# Patient Record
Sex: Male | Born: 1985 | Race: White | Hispanic: No | Marital: Married | Smoking: Never smoker
Health system: Southern US, Community
[De-identification: ages and names within clinical notes are randomized; demographics above are authoritative.]

## PROBLEM LIST (undated history)

## (undated) DIAGNOSIS — F410 Panic disorder [episodic paroxysmal anxiety] without agoraphobia: Secondary | ICD-10-CM

## (undated) DIAGNOSIS — R569 Unspecified convulsions: Secondary | ICD-10-CM

## (undated) HISTORY — DX: Panic disorder (episodic paroxysmal anxiety): F41.0

## (undated) HISTORY — PX: OTHER SURGICAL HISTORY: SHX169

## (undated) HISTORY — DX: Unspecified convulsions: R56.9

## (undated) HISTORY — PX: WISDOM TOOTH EXTRACTION: SHX21

---

## 2012-09-01 ENCOUNTER — Encounter (HOSPITAL_COMMUNITY): Payer: Self-pay | Admitting: *Deleted

## 2012-09-01 ENCOUNTER — Emergency Department (HOSPITAL_COMMUNITY): Payer: 59

## 2012-09-01 ENCOUNTER — Emergency Department (HOSPITAL_COMMUNITY)
Admission: EM | Admit: 2012-09-01 | Discharge: 2012-09-01 | Disposition: A | Discharge status: Home / Self Care | Payer: 59 | Attending: Emergency Medicine | Admitting: Emergency Medicine

## 2012-09-01 DIAGNOSIS — F17200 Nicotine dependence, unspecified, uncomplicated: Secondary | ICD-10-CM | POA: Insufficient documentation

## 2012-09-01 DIAGNOSIS — R569 Unspecified convulsions: Secondary | ICD-10-CM

## 2012-09-01 LAB — COMPREHENSIVE METABOLIC PANEL
ALT: 73 U/L — ABNORMAL HIGH (ref 0–53)
AST: 38 U/L — ABNORMAL HIGH (ref 0–37)
Albumin: 4.6 g/dL (ref 3.5–5.2)
CO2: 20 mEq/L (ref 19–32)
Calcium: 9.6 mg/dL (ref 8.4–10.5)
Chloride: 98 mEq/L (ref 96–112)
GFR calc non Af Amer: >90 mL/min (ref >90)
Sodium: 134 mEq/L — ABNORMAL LOW (ref 135–145)

## 2012-09-01 LAB — CBC WITH DIFFERENTIAL/PLATELET
Basophils Absolute: 0 10*3/uL (ref 0.0–0.1)
Basophils Relative: 0 % (ref 0–1)
Eosinophils Relative: 0 % (ref 0–5)
Lymphocytes Relative: 8 % — ABNORMAL LOW (ref 12–46)
MCHC: 36 g/dL (ref 30.0–36.0)
Neutro Abs: 10.7 10*3/uL — ABNORMAL HIGH (ref 1.7–7.7)
Platelets: 250 10*3/uL (ref 150–400)
RDW: 11.8 % (ref 11.5–15.5)
WBC: 12.4 10*3/uL — ABNORMAL HIGH (ref 4.0–10.5)

## 2012-09-01 NOTE — ED Notes (Signed)
Pt amnesic to event. Reports remembers waking up to paramedics. Reports discomfort to jaw, feels like he was grinding teeth. Was not incontinent of urine. No injury to tongue.

## 2012-09-01 NOTE — ED Notes (Signed)
EMS-pt was at work when he was found by boss on ground, witnessed seizure. Pt was postictal on scene. 18g(L)AC. No hx of the same.

## 2012-09-01 NOTE — ED Provider Notes (Signed)
History     CSN: [id number]  Arrival date & time 09/01/12  1040   First MD Initiated Contact with Patient 09/01/12 1108      Chief Complaint  Patient presents with  . Seizures    (Consider location/radiation/quality/duration/timing/severity/associated sxs/prior treatment) HPI Comments: The patient presents by ems after an apparent seizure.  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  He was at his computer, then woke up with people around him telling him he had a seizure.  He did hit the left side of his head but denies headache or any other complaints leading up to this episode.  No fevers.  Denies excessive alcohol intake and no drug use.  This has never happened to home before.  Patient is a 26 y.o. male presenting with seizures. The history is provided by the patient.  Seizures  This is a new problem. The current episode started less than 1 hour ago. The problem has been resolved. There was 1 seizure. The most recent episode lasted 2 to 5 minutes. Characteristics include rhythmic jerking. The episode was witnessed. There was no sensation of an aura present. The seizures did not continue in the ED. The seizure(s) had no focality. There has been no fever.    History reviewed. No pertinent past medical history.  History reviewed. No pertinent past surgical history.  History reviewed. No pertinent family history.  History  Substance Use Topics  . Smoking status: Current Some Day Smoker  . Smokeless tobacco: Not on file  . Alcohol Use: Yes      Review of Systems  Neurological: Positive for seizures.  All other systems reviewed and are negative.    Allergies  Review of patient's allergies indicates no known allergies.  Home Medications   Current Outpatient Rx  Name Route Sig Dispense Refill  . ZYRTEC PO Oral Take 1 tablet by mouth daily.      BP 141/87  Pulse 114  Temp 98.8 F (37.1 C) (Oral)  Resp 16  SpO2 96%  Physical Exam  Nursing note and vitals  reviewed. Constitutional: He is oriented to person, place, and time. He appears well-developed and well-nourished. No distress.  HENT:  Head: Normocephalic.  Mouth/Throat: Oropharynx is clear and moist.       There is an abrasion/contusion to the left side of the parietal scalp area but no palpable defect.  The tm's are clear bilaterally.  Eyes: EOM are normal. Pupils are equal, round, and reactive to light.  Neck: Normal range of motion. Neck supple.  Cardiovascular: Normal rate and regular rhythm.   No murmur heard. Pulmonary/Chest: Effort normal. No respiratory distress. He has no wheezes.  Abdominal: Soft. Bowel sounds are normal. He exhibits no distension. There is no tenderness.  Musculoskeletal: Normal range of motion. He exhibits no edema.  Neurological: He is alert and oriented to person, place, and time. No cranial nerve deficit. He exhibits normal muscle tone. Coordination normal.  Skin: Skin is warm and dry. He is not diaphoretic.    ED Course  Procedures (including critical care time)   Labs Reviewed  CBC WITH DIFFERENTIAL  COMPREHENSIVE METABOLIC PANEL   No results found.   No diagnosis found.    MDM  The patient was brought here after a new-onset seizure.  He is at baseline and the neuro exam is non-focal.  The workup shows a negative head ct and labs that are essentially unremarkable with the exception of a mildly elevated glucose.  I  have spoken with Dr. Roseanne Reno with neurology who recommends outpatient follow up with neurology.  He will be given the contact information for neurology and is to return prn if symptoms worsen.          Geoffery Lyons, MD 09/01/12 [phone]

## 2013-05-26 IMAGING — CT CT HEAD W/O CM
2 series · 16 of 30 positions shown, 20 images · non-contrast
Comparison: None

CLINICAL DATA: Seizure new onset

CT HEAD WITHOUT CONTRAST
TECHNIQUE: Contiguous axial images were obtained from the base of
the skull through the vertex without contrast.

[Series 3: head w/o · axial · non-contrast · 0.46mm/px · z∈[+300,+430]mm · 13 of 32 slices shown, 17 images]
[im 3/32  brain]
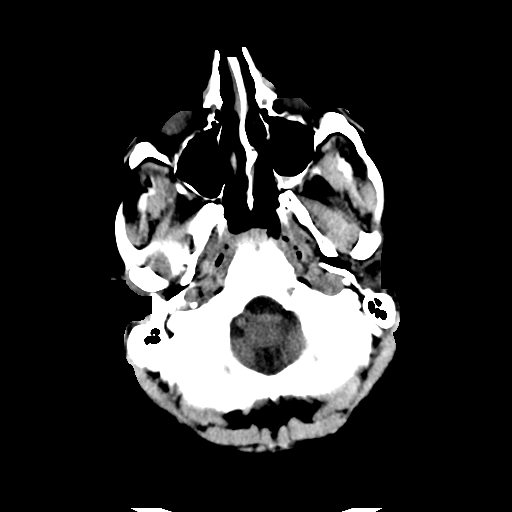
[im 3/32  bone]
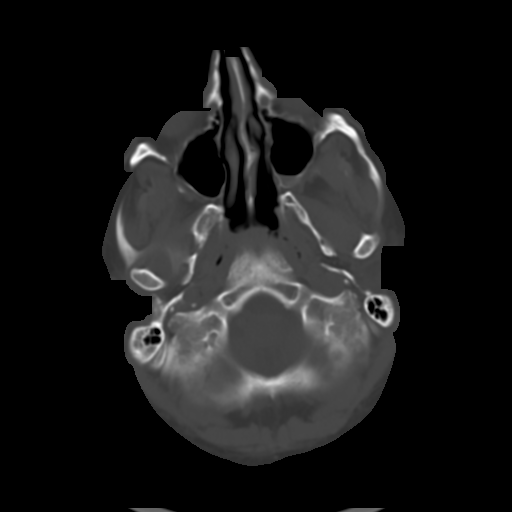
[im 5/32  brain]
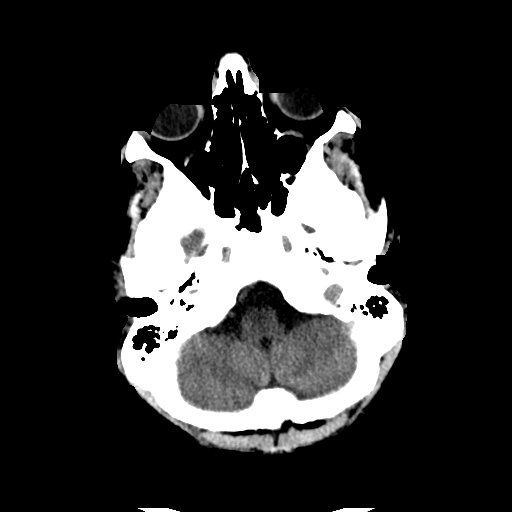
[im 7/32  brain]
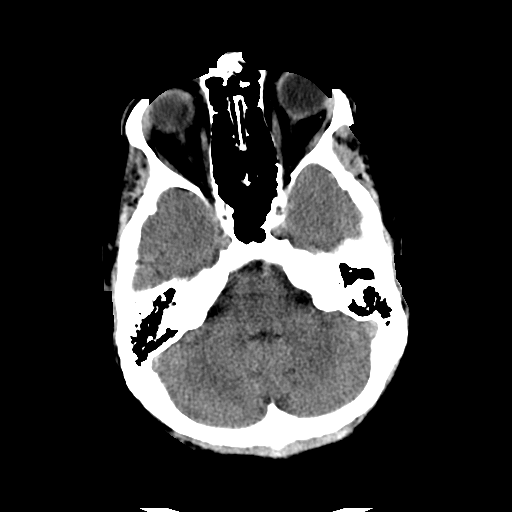
[im 9/32  brain]
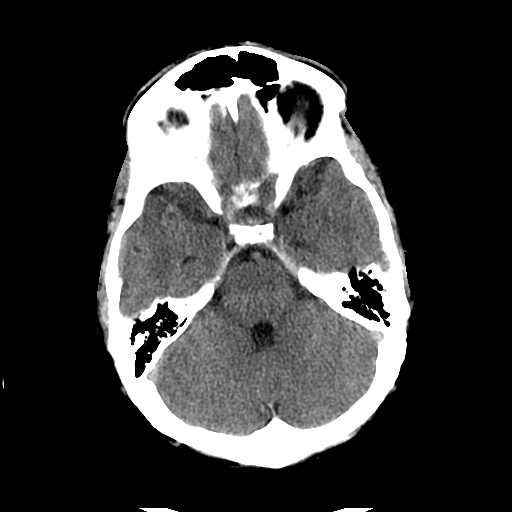
[im 12/32  brain]
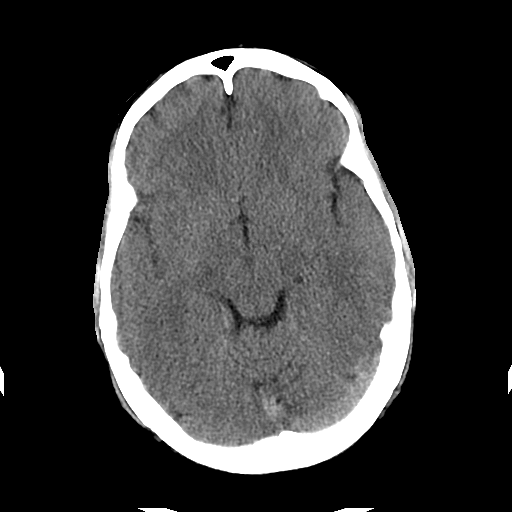
[im 12/32  bone]
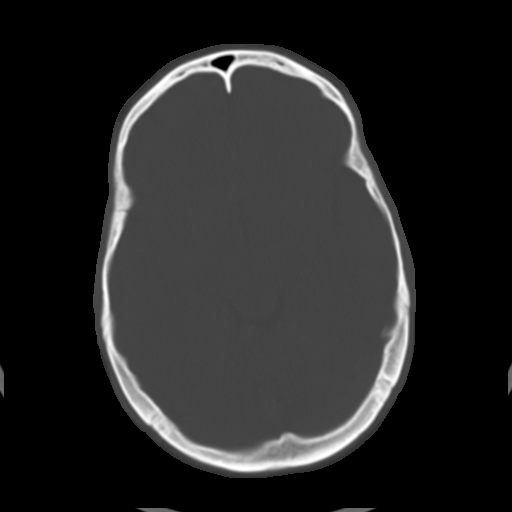
[im 14/32  brain]
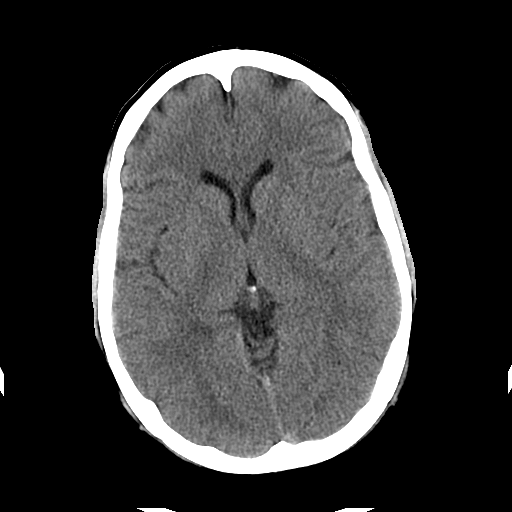
[im 16/32  brain]
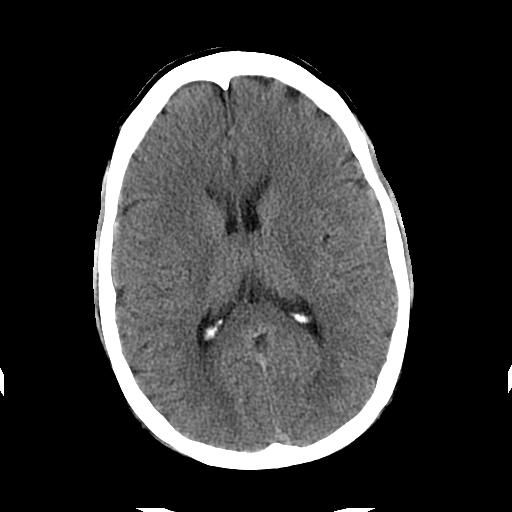
[im 18/32  brain]
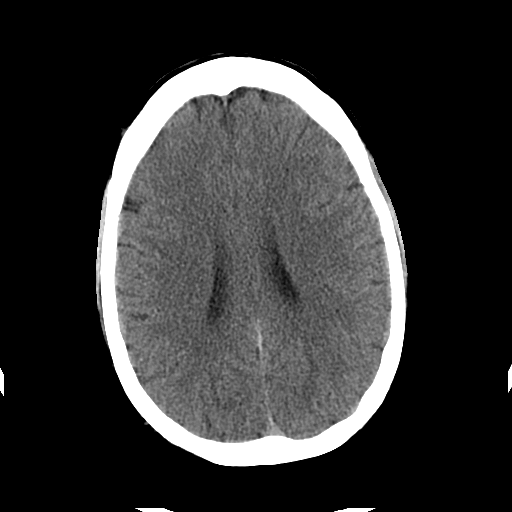
[im 20/32  brain]
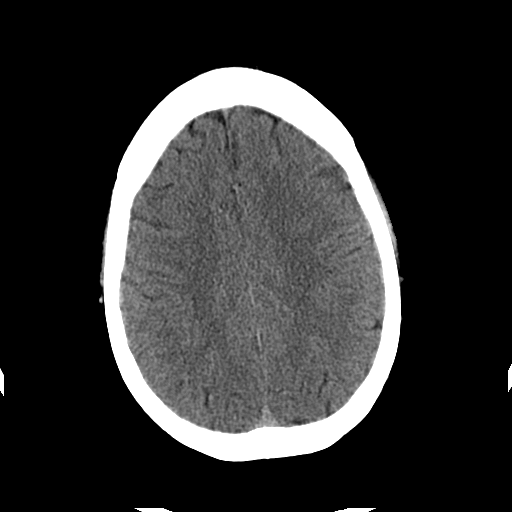
[im 20/32  bone]
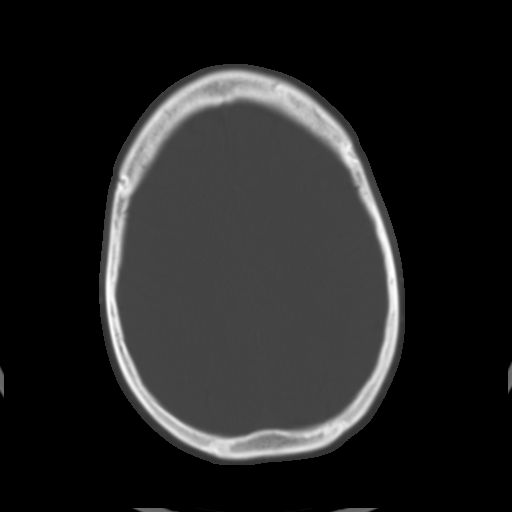
[im 23/32  brain]
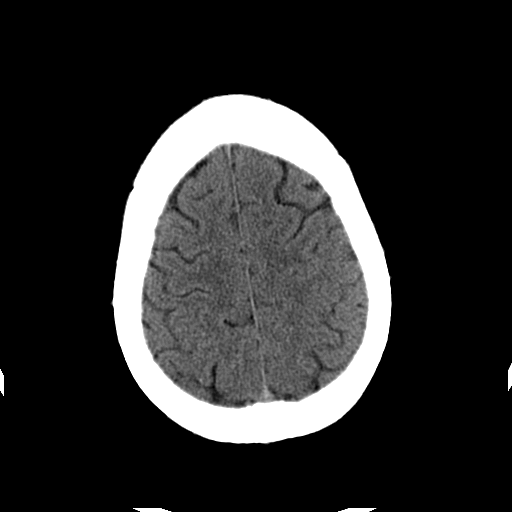
[im 25/32  brain]
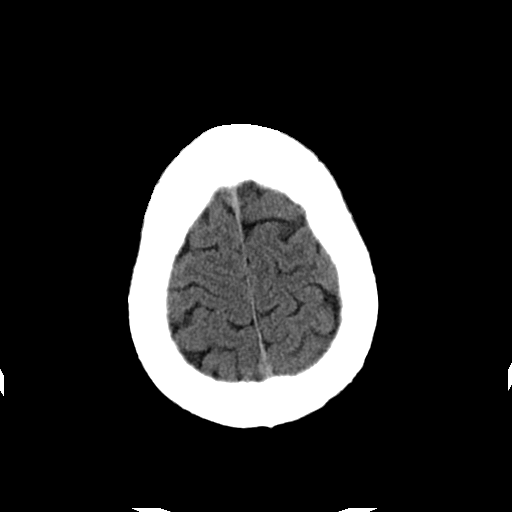
[im 27/32  brain]
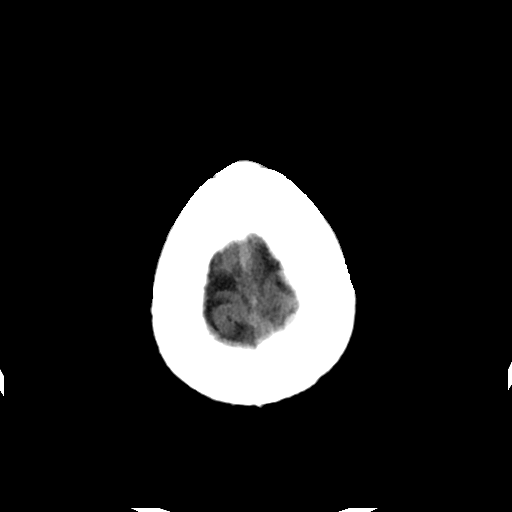
[im 29/32  brain]
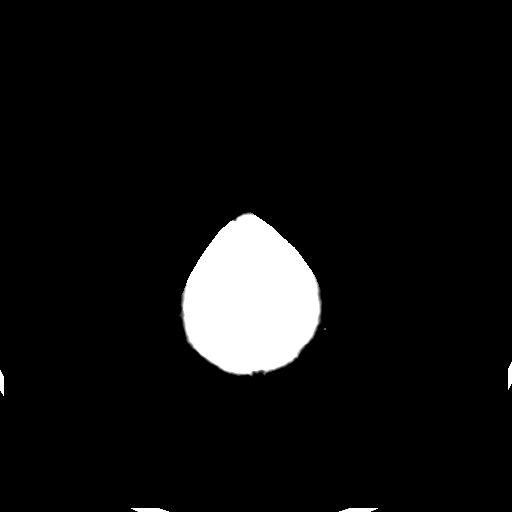
[im 29/32  bone]
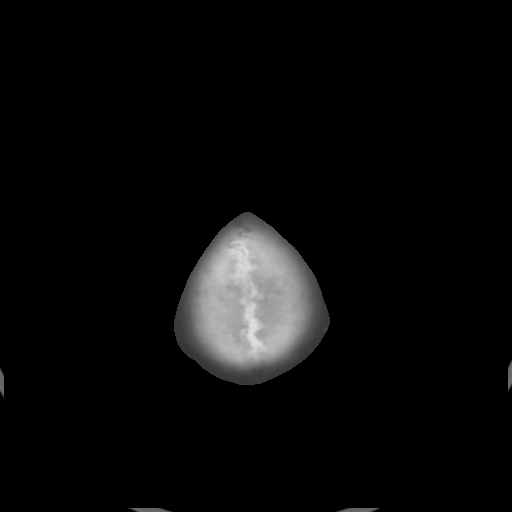

[Series 4: head w/o bone · axial · non-contrast · 0.46mm/px · z∈[+300,+345]mm · 3 of 32 slices shown]
[im 3/32  bone]
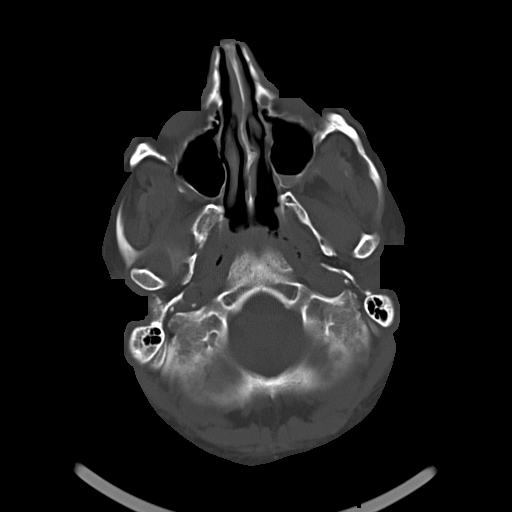
[im 7/32  bone]
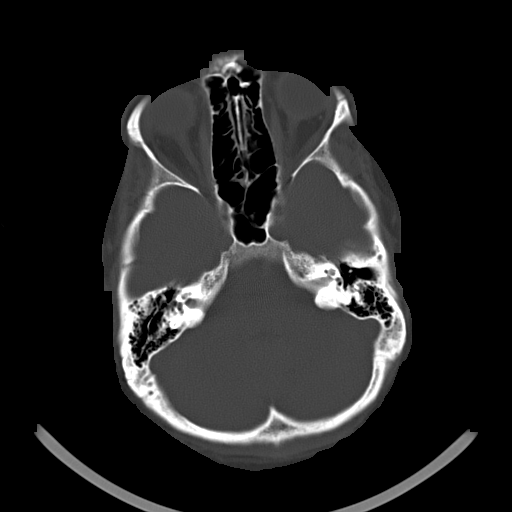
[im 12/32  bone]
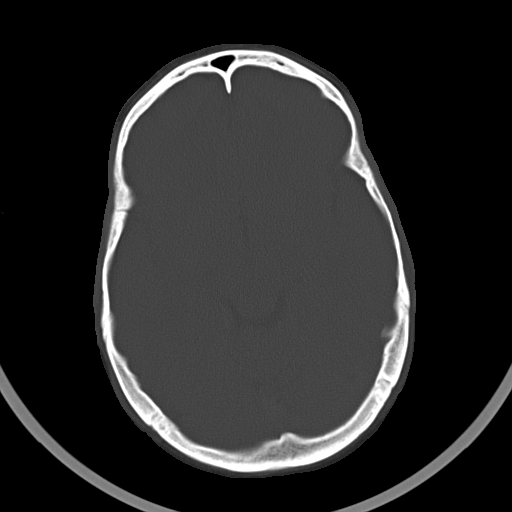

[16 of 30 positions shown; findings below may reference images not displayed]

FINDINGS: Ventricle size is normal.  Negative for infarct mass or
hemorrhage.  No edema or mass effect in the brain.

Calvarium is intact.  Small air-fluid levels in the sphenoid sinus.
Air-fluid level left maxillary sinus.
IMPRESSION: Normal CT of the brain.

Sinusitis.

## 2013-07-22 ENCOUNTER — Telehealth: Payer: Self-pay | Admitting: Nurse Practitioner

## 2013-07-22 ENCOUNTER — Encounter: Payer: Self-pay | Admitting: *Deleted

## 2013-07-22 ENCOUNTER — Encounter: Payer: Self-pay | Admitting: Nurse Practitioner

## 2013-08-09 ENCOUNTER — Encounter: Payer: Self-pay | Admitting: Nurse Practitioner

## 2013-08-09 ENCOUNTER — Ambulatory Visit (INDEPENDENT_AMBULATORY_CARE_PROVIDER_SITE_OTHER): Payer: 59 | Admitting: Nurse Practitioner

## 2013-08-09 VITALS — BP 134/81 | HR 76 | Ht 71.0 in | Wt 190.0 lb

## 2013-08-09 DIAGNOSIS — G40309 Generalized idiopathic epilepsy and epileptic syndromes, not intractable, without status epilepticus: Secondary | ICD-10-CM | POA: Insufficient documentation

## 2013-08-09 DIAGNOSIS — F410 Panic disorder [episodic paroxysmal anxiety] without agoraphobia: Secondary | ICD-10-CM | POA: Insufficient documentation

## 2013-08-09 MED ORDER — CLONAZEPAM 1 MG PO TABS: 1.0000 mg | ORAL_TABLET | Freq: Three times a day (TID) | ORAL | Status: DC

## 2013-08-09 NOTE — Patient Instructions (Addendum)
Continue Clonazepam 1mg   3 times daily Will renew  Prescription Followup 6-8 months

## 2013-08-09 NOTE — Progress Notes (Signed)
  Reason for visit followup for panic disorder HPI: [name] is a 27 year old right-handed white male with a history of a panic disorder. The patient has been placed on clonazepam, and he is tolerating the medication quite well. The patient is on 1 mg, taking 3 times daily .The patient indicates that overall, he feels as if the medication has changed his life, and he is able to function much better. The patient is tolerating medication well without drowsiness or gait instability. Patient has a history of isolated witnessed generalized seizure in September 2013. CT was unremarkable, normal EEG. No further seizure activity since that time  ROS:  Negative   Medications Current Outpatient Prescriptions on File Prior to Visit  Medication Sig Dispense Refill  . Cetirizine HCl (ZYRTEC PO) Take 1 tablet by mouth daily.       No current facility-administered medications on file prior to visit.    Allergies No Known Allergies  Physical Exam General: well developed, well nourished, seated, in no evident distress Neurologic Exam Mental Status: Awake and fully alert. Oriented to place and time. Follows all commands. Speech and language normal.   Cranial Nerves:  Pupils equal, briskly reactive to light. Extraocular movements full without nystagmus. Visual fields full to confrontation. Hearing intact and symmetric to finger snap. Facial sensation intact. Face, tongue, palate move normally and symmetrically. Neck flexion and extension normal.  Motor: Normal bulk and tone. Normal strength in all tested extremity muscles.No focal weakness Coordination: Rapid alternating movements normal in all extremities. Finger-to-nose and heel-to-shin performed accurately bilaterally. Gait and Station: Arises from chair without difficulty. Stance is normal. Gait demonstrates normal stride length and balance . Able to heel, toe and tandem walk without difficulty.  Reflexes: 2+ and symmetric. Toes downgoing.      ASSESSMENT: Panic disorder doing well on clonazepam     PLAN: Continue Clonazepam 1mg   3 times daily Will renew  Prescription Followup 6-8 months  Nilda Riggs, GNP-BC APRN

## 2013-08-09 NOTE — Progress Notes (Signed)
I have read the note, and I agree with the clinical assessment and plan.  WILLIS,CHARLES KEITH   

## 2013-08-24 ENCOUNTER — Ambulatory Visit: Payer: Self-pay | Admitting: Nurse Practitioner

## 2013-11-21 ENCOUNTER — Encounter: Payer: Self-pay | Admitting: Nurse Practitioner

## 2014-02-09 ENCOUNTER — Ambulatory Visit: Payer: 59 | Admitting: Nurse Practitioner

## 2014-02-16 ENCOUNTER — Other Ambulatory Visit: Payer: Self-pay | Admitting: Nurse Practitioner

## 2014-02-17 ENCOUNTER — Telehealth: Payer: Self-pay | Admitting: Nurse Practitioner

## 2014-02-17 NOTE — Telephone Encounter (Signed)
Patient calling to state that [facility] in [place] should have faxed over a request for clonazepam 1 mg 3 times daily yesterday. Please call to advise.

## 2014-02-17 NOTE — Telephone Encounter (Signed)
Please see previous note.  This has already been faxed to the pharmacy.  I called the patient back.  Got no answer.  Left message.

## 2014-02-17 NOTE — Telephone Encounter (Signed)
Rx signed and faxed.

## 2014-03-17 NOTE — Telephone Encounter (Signed)
Closing encounter

## 2014-03-28 ENCOUNTER — Ambulatory Visit (INDEPENDENT_AMBULATORY_CARE_PROVIDER_SITE_OTHER): Payer: 59 | Admitting: Nurse Practitioner

## 2014-03-28 ENCOUNTER — Encounter: Payer: Self-pay | Admitting: Nurse Practitioner

## 2014-03-28 ENCOUNTER — Encounter (INDEPENDENT_AMBULATORY_CARE_PROVIDER_SITE_OTHER): Payer: Self-pay

## 2014-03-28 VITALS — BP 135/86 | HR 80 | Ht 71.0 in | Wt 201.0 lb

## 2014-03-28 DIAGNOSIS — G40309 Generalized idiopathic epilepsy and epileptic syndromes, not intractable, without status epilepticus: Secondary | ICD-10-CM

## 2014-03-28 DIAGNOSIS — F410 Panic disorder [episodic paroxysmal anxiety] without agoraphobia: Secondary | ICD-10-CM

## 2014-03-28 NOTE — Progress Notes (Signed)
[facility]  PATIENT: Jay Francis DOB: 07/14/1986   REASON FOR VISIT: follow up for panic disorder   HISTORY OF PRESENT ILLNESS: Jay Francis, 28 year old male returns for followup. He has a history of isolated seizure in September 2013 without further seizure events. He also has panic disorder which is currently well controlled on clonazepam. He denies any side effects to the medication, no drowsiness or gait instability. He works full time. He returns for reevaluation   HISTORY:of a panic disorder. The patient has been placed on clonazepam, and he is tolerating the medication quite well. The patient is on 1 mg, taking 3 times daily .The patient indicates that overall, he feels as if the medication has changed his life, and he is able to function much better. The patient is tolerating medication well without drowsiness or gait instability. Patient has a history of isolated witnessed generalized seizure in September 2013. CT was unremarkable, normal EEG. No further seizure activity since that time   REVIEW OF SYSTEMS: Full 14 system review of systems performed and notable only for those listed, all others are neg:  Constitutional: N/A  Cardiovascular: N/A  Ear/Nose/Throat: N/A  Skin: N/A  Eyes: N/A  Respiratory: N/A  Gastroitestinal: N/A  Hematology/Lymphatic: N/A  Endocrine: N/A Musculoskeletal:N/A  Allergy/Immunology: N/A  Neurological: N/A Psychiatric: Anxiety  ALLERGIES: No Known Allergies  HOME MEDICATIONS: Outpatient Prescriptions Prior to Visit  Medication Sig Dispense Refill  . Cetirizine HCl (ZYRTEC PO) Take 1 tablet by mouth daily.      . clonazePAM (KLONOPIN) 1 MG tablet TAKE 1 TABLET BY MOUTH THREE TIMES DAILY  90 tablet  5   No facility-administered medications prior to visit.    PAST MEDICAL HISTORY: Past Medical History  Diagnosis Date  . Seizures   . Panic disorder without agoraphobia     PAST SURGICAL HISTORY: Past Surgical History   Procedure Laterality Date  . None      FAMILY HISTORY: History reviewed. No pertinent family history.  SOCIAL HISTORY: History   Social History  . Marital Status: Single    Spouse Name: N/A    Number of Children: 0  . Years of Education: Associates   Occupational History  . LOGISTICS    Social History Main Topics  . Smoking status: Former Games developermoker  . Smokeless tobacco: Never Used  . Alcohol Use: No  . Drug Use: No  . Sexual Activity: Not on file   Other Topics Concern  . Not on file   Social History Narrative   Patient is single.    Patient has no children.    Patient is full-time.    Patient has an Associates degree.   Patient is right handed.      PHYSICAL EXAM  Filed Vitals:   03/28/14 0828  BP: 135/86  Pulse: 80  Height: 5\' 11"  (1.803 m)  Weight: 201 lb (91.173 kg)   Body mass index is 28.05 kg/(m^2).  Generalized: Well developed, in no acute distress  Neurological examination   Mentation: Alert oriented to time, place, history taking. Follows all commands speech and language fluent  Cranial nerve II-XII: Pupils were equal round reactive to light extraocular movements were full, visual field were full on confrontational test. Facial sensation and strength were normal. hearing was intact to finger rubbing bilaterally. Uvula tongue midline. head turning and shoulder shrug were normal and symmetric.Tongue protrusion into cheek strength was normal. Motor: normal bulk and tone, full strength in the BUE, BLE,  No focal weakness  Coordination: finger-nose-finger, heel-to-shin bilaterally, no dysmetria Reflexes: Brachioradialis 2/2, biceps 2/2, triceps 2/2, patellar 2/2, Achilles 2/2, plantar responses were flexor bilaterally. Gait and Station: Rising up from seated position without assistance, normal stance,  moderate stride, good arm swing, smooth turning, able to perform tiptoe, and heel walking without difficulty. Tandem gait is steady  DIAGNOSTIC DATA  (LABS, IMAGING, TESTING) - ASSESSMENT AND PLAN  28 y.o. year old male  has a past medical history of Seizures and Panic disorder without agoraphobia. here  to follow up. Doing well on Clonazepam for panic disorder.  Continue clonazepam at current dose , does not need  refills Followup yearly and when necessary Nilda RiggsNancy Carolyn Valeriano Bain, [facility] HospitalGNP, [facility] AllianceBC, APRN  [facility] [address], [address] [place], [place] [address] [phone]

## 2014-03-28 NOTE — Patient Instructions (Signed)
Continue clonazepam at current dose , does not need  Refills Followup yearly and when necessary

## 2014-03-28 NOTE — Progress Notes (Signed)
I have read the note, and I agree with the clinical assessment and plan.  Jay Francis   

## 2014-08-23 ENCOUNTER — Telehealth: Payer: Self-pay | Admitting: Neurology

## 2014-08-23 ENCOUNTER — Other Ambulatory Visit: Payer: Self-pay | Admitting: Neurology

## 2014-08-23 NOTE — Telephone Encounter (Signed)
Patient called and relayed that the pharmacy did not have his prescription for Klonopin yet.  He asked that it be faxed so he can pick it up on his way to work in am.  I have faxed the prescription to his [facility] pharmacy and received confirmation.

## 2015-02-19 ENCOUNTER — Other Ambulatory Visit: Payer: Self-pay | Admitting: Neurology

## 2015-02-20 ENCOUNTER — Other Ambulatory Visit: Payer: Self-pay

## 2015-02-20 MED ORDER — CLONAZEPAM 1 MG PO TABS: 1.0000 mg | ORAL_TABLET | Freq: Three times a day (TID) | ORAL | Status: DC

## 2015-02-20 NOTE — Telephone Encounter (Signed)
Rx signed and faxed.

## 2015-03-29 ENCOUNTER — Ambulatory Visit (INDEPENDENT_AMBULATORY_CARE_PROVIDER_SITE_OTHER): Payer: 59 | Admitting: Nurse Practitioner

## 2015-03-29 ENCOUNTER — Encounter: Payer: Self-pay | Admitting: Nurse Practitioner

## 2015-03-29 VITALS — BP 142/91 | HR 67 | Ht 71.0 in | Wt 196.2 lb

## 2015-03-29 DIAGNOSIS — G40309 Generalized idiopathic epilepsy and epileptic syndromes, not intractable, without status epilepticus: Secondary | ICD-10-CM

## 2015-03-29 DIAGNOSIS — F410 Panic disorder [episodic paroxysmal anxiety] without agoraphobia: Secondary | ICD-10-CM

## 2015-03-29 MED ORDER — CLONAZEPAM 1 MG PO TABS: 1.0000 mg | ORAL_TABLET | Freq: Three times a day (TID) | ORAL | Status: DC

## 2015-03-29 NOTE — Progress Notes (Signed)
[facility]  PATIENT: Jay Francis DOB: [birth year]   REASON FOR VISIT:follow up for isolated seizure/ panic disorder HISTORY FROM:patient    HISTORY OF PRESENT ILLNESS:Jay Francis, 29 year old male returns for followup. He has a history of isolated seizure in September 2013 without further seizure events. He also has panic disorder which is currently well controlled on clonazepam. He denies any side effects to the medication, no drowsiness or gait instability. He works full time. He returns for reevaluation. He does not have a primary care provider.   HISTORY:of a panic disorder. The patient has been placed on clonazepam, and he is tolerating the medication quite well. The patient is on 1 mg, taking 3 times daily .The patient indicates that overall, he feels as if the medication has changed his life, and he is able to function much better. The patient is tolerating medication well without drowsiness or gait instability. Patient has a history of isolated witnessed generalized seizure in September 2013. CT was unremarkable, normal EEG. No further seizure activity since that time   REVIEW OF SYSTEMS: Full 14 system review of systems performed and notable only for those listed, all others are neg:  Constitutional: neg  Cardiovascular: neg Ear/Nose/Throat: neg  Skin: neg Eyes: neg Respiratory: neg Gastroitestinal: neg  Hematology/Lymphatic: neg  Endocrine: neg Musculoskeletal:neg Allergy/Immunology: neg Neurological: Isolated seizure event Psychiatric: Panic disorder Sleep : neg   ALLERGIES: Allergies  Allergen Reactions  . Amoxicillin Diarrhea and Nausea Only    HOME MEDICATIONS: Outpatient Prescriptions Prior to Visit  Medication Sig Dispense Refill  . Cetirizine HCl (ZYRTEC PO) Take 1 tablet by mouth daily.    . clonazePAM (KLONOPIN) 1 MG tablet Take 1 tablet (1 mg total) by mouth 3 (three) times daily. 90 tablet 1   No facility-administered medications  prior to visit.    PAST MEDICAL HISTORY: Past Medical History  Diagnosis Date  . Seizures   . Panic disorder without agoraphobia     PAST SURGICAL HISTORY: Past Surgical History  Procedure Laterality Date  . None      FAMILY HISTORY: Family History  Problem Relation Age of Onset  . Healthy Mother   . Healthy Father   . Seizures Maternal Grandfather     SOCIAL HISTORY: History   Social History  . Marital Status: Single    Spouse Name: N/A  . Number of Children: 0  . Years of Education: Associates   Occupational History  . LOGISTICS    Social History Main Topics  . Smoking status: Former Games developer  . Smokeless tobacco: Never Used  . Alcohol Use: No  . Drug Use: No  . Sexual Activity: Not on file   Other Topics Concern  . Not on file   Social History Narrative   Patient is single.    Patient has no children.    Patient is full-time.    Patient has an Associates degree.   Patient is right handed.      PHYSICAL EXAM  Filed Vitals:   03/29/15 0754  BP: 142/91  Pulse: 67  Height:  (1.803 m)  Weight: 196 lb 3.2 oz (88.996 kg)   Body mass index is 27.38 kg/(m^2).  Generalized: Well developed, in no acute distress  Head: normocephalic and atraumatic,. Oropharynx benign  Neck: Supple,  Neurological examination   Mentation: Alert oriented to time, place, history taking. Attention span and concentration appropriate. Recent and remote memory intact.  Follows all commands speech and language fluent.   Cranial nerve  II-XII: Pupils were equal round reactive to light extraocular movements were full, visual field were full on confrontational test. Facial sensation and strength were normal. hearing was intact to finger rubbing bilaterally. Uvula tongue midline. head turning and shoulder shrug were normal and symmetric.Tongue protrusion into cheek strength was normal. Motor: normal bulk and tone, full strength in the BUE, BLE, fine finger movements normal, no  pronator drift. No focal weakness Sensory: normal and symmetric to light touch, pinprick, and  Vibration, proprioception  Coordination: finger-nose-finger, heel-to-shin bilaterally, no dysmetria Reflexes: Brachioradialis 2/2, biceps 2/2, triceps 2/2, patellar 2/2, Achilles 2/2, plantar responses were flexor bilaterally. Gait and Station: Rising up from seated position without assistance, normal stance,  moderate stride, good arm swing, smooth turning, able to perform tiptoe, and heel walking without difficulty. Tandem gait is steady  DIAGNOSTIC DATA (LABS, IMAGING, TESTING) -  ASSESSMENT AND PLAN  29 y.o. year old male  has a past medical history of Seizures and Panic disorder without agoraphobia. here to follow-up. He has not had further seizure events and his panic disorder is under control with Klonopin  Continue clonazepam at current dose Rx to patient Call for any seizure activity Follow-up yearly and when necessary Nilda RiggsNancy Carolyn Lavinia Mcneely, [facility] AdaGNP, [facility]BC, APRN  [facility] [address], [address] [place], [place] [address] [phone]

## 2015-03-29 NOTE — Progress Notes (Signed)
I have read the note, and I agree with the clinical assessment and plan.  Paulena Servais KEITH   

## 2015-03-29 NOTE — Patient Instructions (Signed)
Continue clonazepam at current dose Rx to patient Call for any seizure activity Follow-up yearly and when necessary

## 2015-10-18 ENCOUNTER — Other Ambulatory Visit: Payer: Self-pay | Admitting: Nurse Practitioner

## 2015-10-19 ENCOUNTER — Telehealth: Payer: Self-pay | Admitting: Neurology

## 2015-10-19 NOTE — Telephone Encounter (Signed)
I called the pharmacy and called in another refill for the clonazepam with 90 tablets given, 5 refills. I records indicate that this prescription was written yesterday, if the pharmacy gets this prescription, they are to disregard the prescription.

## 2015-10-19 NOTE — Telephone Encounter (Signed)
A prescription for clonazepam was called in. 

## 2016-03-21 ENCOUNTER — Encounter: Payer: Self-pay | Admitting: Nurse Practitioner

## 2016-03-21 ENCOUNTER — Ambulatory Visit (INDEPENDENT_AMBULATORY_CARE_PROVIDER_SITE_OTHER): Payer: 59 | Admitting: Nurse Practitioner

## 2016-03-21 VITALS — BP 134/81 | HR 87 | Ht 71.0 in | Wt 189.6 lb

## 2016-03-21 DIAGNOSIS — F410 Panic disorder [episodic paroxysmal anxiety] without agoraphobia: Secondary | ICD-10-CM

## 2016-03-21 DIAGNOSIS — G40309 Generalized idiopathic epilepsy and epileptic syndromes, not intractable, without status epilepticus: Secondary | ICD-10-CM | POA: Diagnosis not present

## 2016-03-21 MED ORDER — CLONAZEPAM 1 MG PO TABS: 1.0000 mg | ORAL_TABLET | Freq: Three times a day (TID) | ORAL | Status: DC

## 2016-03-21 NOTE — Progress Notes (Signed)
[facility]  PATIENT: Jay Francis DOB: 09/29/1986   REASON FOR VISIT: Follow-up for isolated generalized seizure and panic disorder HISTORY FROM: Patient    HISTORY OF PRESENT ILLNESS:Jay Francis, 30 year old male returns for followup. He has a history of isolated seizure in September 2013 without further seizure events. He also has panic disorder which is currently well controlled on clonazepam. He denies any side effects to the medication, no drowsiness or gait instability. He works full time. He returns for reevaluation. He does not have a primary care provider. He states that his brother was recently diagnosed with panic disorder.   HISTORY:of a panic disorder. The patient has been placed on clonazepam, and he is tolerating the medication quite well. The patient is on 1 mg, taking 3 times daily .The patient indicates that overall, he feels as if the medication has changed his life, and he is able to function much better. The patient is tolerating medication well without drowsiness or gait instability. Patient has a history of isolated witnessed generalized seizure in September 2013. CT was unremarkable, normal EEG. No further seizure activity since that time    REVIEW OF SYSTEMS: Full 14 system review of systems performed and notable only for those listed, all others are neg:  Constitutional: neg  Cardiovascular: neg Ear/Nose/Throat: neg  Skin: neg Eyes: neg Respiratory: neg Gastroitestinal: neg  Hematology/Lymphatic: neg  Endocrine: neg Musculoskeletal:neg Allergy/Immunology: neg Neurological: Isolated seizure event  Psychiatric: Depression and anxiety panic disorder Sleep : neg   ALLERGIES: Allergies  Allergen Reactions  . Amoxicillin Diarrhea and Nausea Only    HOME MEDICATIONS: Outpatient Prescriptions Prior to Visit  Medication Sig Dispense Refill  . Cetirizine HCl (ZYRTEC PO) Take 1 tablet by mouth daily.    . clonazePAM (KLONOPIN) 1 MG  tablet TAKE 1 TABLET BY MOUTH THREE TIMES DAILY 90 tablet 5  . Multiple Vitamin (MULTIVITAMIN) capsule Take 1 capsule by mouth daily.     No facility-administered medications prior to visit.    PAST MEDICAL HISTORY: Past Medical History  Diagnosis Date  . Seizures (HCC)   . Panic disorder without agoraphobia     PAST SURGICAL HISTORY: Past Surgical History  Procedure Laterality Date  . None      FAMILY HISTORY: Family History  Problem Relation Age of Onset  . Healthy Mother   . Healthy Father   . Seizures Maternal Grandfather     SOCIAL HISTORY: Social History   Social History  . Marital Status: Single    Spouse Name: N/A  . Number of Children: 0  . Years of Education: Associates   Occupational History  . LOGISTICS    Social History Main Topics  . Smoking status: Former Games developermoker  . Smokeless tobacco: Never Used  . Alcohol Use: No  . Drug Use: No  . Sexual Activity: Not on file   Other Topics Concern  . Not on file   Social History Narrative   Patient is single.    Patient has no children.    Patient is full-time.    Patient has an Associates degree.   Patient is right handed.      PHYSICAL EXAM  Filed Vitals:   03/21/16 0758  BP: 134/81  Pulse: 87  Height: 5\' 11"  (1.803 m)  Weight: 189 lb 9.6 oz (86.002 kg)   Body mass index is 26.46 kg/(m^2). Generalized: Well developed, in no acute distress  Head: normocephalic and atraumatic,. Oropharynx benign  Neck: Supple,  Neurological examination  Mentation: Alert oriented to time, place, history taking. Attention span and concentration appropriate. Recent and remote memory intact. Follows all commands speech and language fluent.   Cranial nerve II-XII: Pupils were equal round reactive to light extraocular movements were full, visual field were full on confrontational test. Facial sensation and strength were normal. hearing was intact to finger rubbing bilaterally. Uvula tongue midline. head  turning and shoulder shrug were normal and symmetric.Tongue protrusion into cheek strength was normal. Motor: normal bulk and tone, full strength in the BUE, BLE, fine finger movements normal, no pronator drift. No focal weakness Sensory: normal and symmetric to light touch, pinprick, and Vibration,  Coordination: finger-nose-finger, heel-to-shin bilaterally, no dysmetria Reflexes: Brachioradialis 2/2, biceps 2/2, triceps 2/2, patellar 2/2, Achilles 2/2, plantar responses were flexor bilaterally. Gait and Station: Rising up from seated position without assistance, normal stance, moderate stride, good arm swing, smooth turning, able to perform tiptoe, and heel walking without difficulty. Tandem gait is steady  DIAGNOSTIC DATA (LABS, IMAGING, TESTING) -  ASSESSMENT AND PLAN 30 y.o. year old male  has a past medical history of isolated seizure and Panic disorder without agoraphobia. here to follow-up. He has not had further seizure events and his panic disorder is under control with Klonopin  Continue clonazepam at current dose Rx to patient Call for any seizure activity Follow-up yearly and when necessary Nilda Riggs, [facility], [facility], APRN  [facility] [address] [place], [place] [address] [phone]

## 2016-03-21 NOTE — Progress Notes (Signed)
I have read the note, and I agree with the clinical assessment and plan.  Jay Francis KEITH   

## 2016-03-21 NOTE — Patient Instructions (Signed)
Continue clonazepam at current dose Rx to patient Call for any seizure activity Follow-up yearly and when necessary

## 2016-03-28 ENCOUNTER — Ambulatory Visit: Payer: 59 | Admitting: Nurse Practitioner

## 2016-06-30 ENCOUNTER — Telehealth: Payer: Self-pay | Admitting: Nurse Practitioner

## 2016-06-30 NOTE — Telephone Encounter (Signed)
Pt at work this am.  Had ? Seizures, passed out.   He stated has not had sz since 2013, and this was not like the last one. He has been over worked, no food, no sleep.   Does not think it sz.  I told him that would seek urgent care evaluation today.  Appt made for tomorrow at 330pm with CM/NP for eval.    Recommended having driver take him, as ? Sz.  He verbalized understanding.  Needs note for work (ok to go back).

## 2016-06-30 NOTE — Telephone Encounter (Signed)
Patient called, states he had seizure at work this morning and needs a note before he can return to work.

## 2016-06-30 NOTE — Telephone Encounter (Signed)
No work note until seen

## 2016-07-01 ENCOUNTER — Ambulatory Visit (INDEPENDENT_AMBULATORY_CARE_PROVIDER_SITE_OTHER): Payer: 59 | Admitting: Nurse Practitioner

## 2016-07-01 ENCOUNTER — Encounter: Payer: Self-pay | Admitting: *Deleted

## 2016-07-01 ENCOUNTER — Encounter: Payer: Self-pay | Admitting: Nurse Practitioner

## 2016-07-01 VITALS — BP 147/97 | HR 98 | Ht 71.0 in | Wt 183.0 lb

## 2016-07-01 DIAGNOSIS — G40309 Generalized idiopathic epilepsy and epileptic syndromes, not intractable, without status epilepticus: Secondary | ICD-10-CM | POA: Diagnosis not present

## 2016-07-01 DIAGNOSIS — F410 Panic disorder [episodic paroxysmal anxiety] without agoraphobia: Secondary | ICD-10-CM

## 2016-07-01 NOTE — Progress Notes (Addendum)
[facility]  PATIENT: Marcell Barlowndrew Ricotta DOB: 10/03/1986   REASON FOR VISIT: Follow-up for generalized epilepsy, panic disorder HISTORY FROM: Patient    HISTORY OF PRESENT ILLNESS:[name], 30 year old male returns for followup. He has a history of isolated seizure in September 2013 and another seizure event Sunday the 16th of July after missing 2 doses of his clonazepam . He also had not eaten and he had not slept well the night before. He also has panic disorder which is currently well controlled on clonazepam. He denies any side effects to the medication, no drowsiness or gait instability. He works full time. He returns for reevaluation. He does not have a primary care provider.   HISTORY:of a panic disorder. The patient has been placed on clonazepam, and he is tolerating the medication quite well. The patient is on 1 mg, taking 3 times daily .The patient indicates that overall, he feels as if the medication has changed his life, and he is able to function much better. The patient is tolerating medication well without drowsiness or gait instability. Patient has a history of isolated witnessed generalized seizure in September 2013. CT was unremarkable, normal EEG. No further seizure activity since that time   REVIEW OF SYSTEMS: Full 14 system review of systems performed and notable only for those listed, all others are neg:  Constitutional: neg  Cardiovascular: neg Ear/Nose/Throat: neg  Skin: neg Eyes: neg Respiratory: neg Gastroitestinal: neg  Hematology/Lymphatic: neg  Endocrine: neg Musculoskeletal:neg Allergy/Immunology: neg Neurological: Seizure  Psychiatric: Panic disorder Sleep : neg   ALLERGIES: Allergies  Allergen Reactions  . Amoxicillin Diarrhea and Nausea Only    HOME MEDICATIONS: Outpatient Prescriptions Prior to Visit  Medication Sig Dispense Refill  . Cetirizine HCl (ZYRTEC PO) Take 1 tablet by mouth daily.    . clonazePAM (KLONOPIN) 1 MG  tablet Take 1 tablet (1 mg total) by mouth 3 (three) times daily. 90 tablet 5  . Multiple Vitamin (MULTIVITAMIN) capsule Take 1 capsule by mouth daily.     No facility-administered medications prior to visit.    PAST MEDICAL HISTORY: Past Medical History  Diagnosis Date  . Seizures (HCC)   . Panic disorder without agoraphobia     PAST SURGICAL HISTORY: Past Surgical History  Procedure Laterality Date  . None      FAMILY HISTORY: Family History  Problem Relation Age of Onset  . Healthy Mother   . Healthy Father   . Seizures Maternal Grandfather     SOCIAL HISTORY: Social History   Social History  . Marital Status: Single    Spouse Name: N/A  . Number of Children: 0  . Years of Education: Associates   Occupational History  . LOGISTICS    Social History Main Topics  . Smoking status: Former Games developermoker  . Smokeless tobacco: Never Used  . Alcohol Use: No  . Drug Use: No  . Sexual Activity: Not on file   Other Topics Concern  . Not on file   Social History Narrative   Patient is single.    Patient has no children.    Patient is full-time.    Patient has an Associates degree.   Patient is right handed.      PHYSICAL EXAM  Filed Vitals:   07/01/16 1517  BP: 147/97  Pulse: 98  Height: 5\' 11"  (1.803 m)  Weight: 183 lb (83.008 kg)   Body mass index is 25.53 kg/(m^2). Generalized: Well developed, in no acute distress  Head: normocephalic and atraumatic,.  Oropharynx benign  Neck: Supple,  Neurological examination   Mentation: Alert oriented to time, place, history taking. Attention span and concentration appropriate. Recent and remote memory intact. Follows all commands speech and language fluent.   Cranial nerve II-XII: Pupils were equal round reactive to light extraocular movements were full, visual field were full on confrontational test. Facial sensation and strength were normal. hearing was intact to finger rubbing bilaterally. Uvula tongue midline.  head turning and shoulder shrug were normal and symmetric.Tongue protrusion into cheek strength was normal. Motor: normal bulk and tone, full strength in the BUE, BLE, fine finger movements normal, no pronator drift. No focal weakness Sensory: normal and symmetric to light touch, pinprick, and Vibration,  Coordination: finger-nose-finger, heel-to-shin bilaterally, no dysmetria Reflexes: Brachioradialis 2/2, biceps 2/2, triceps 2/2, patellar 2/2, Achilles 2/2, plantar responses were flexor bilaterally. Gait and Station: Rising up from seated position without assistance, normal stance, moderate stride, good arm swing, smooth turning, able to perform tiptoe, and heel walking without difficulty. Tandem gait is steady   DIAGNOSTIC DATA (LABS, IMAGING, TESTING) -  ASSESSMENT AND PLAN 30 y.o. year old male has a past medical history of isolated seizure event and another seizure on 06/29/16 after missing 2 doses of his clonazepam, did not eat and  had not slept well the night before.  Panic disorder without agoraphobia. here to follow-up. His panic disorder is under control with Klonopin.The patient is a current patient of Dr. Anne Hahn  who is out of the office today . This note is sent to the work in doctor.     Continue clonazepam at current dose Rx to patient do not miss doses Call for any seizure activity Follow-up in 6 months Nilda Riggs, [facility], [facility], APRN  [facility] [address] [place], [place] [address] [phone]  I reviewed the above note and documentation by the Nurse Practitioner and agree with the history, physical exam, assessment and plan as outlined above. I was immediately available for face-to-face consultation. Huston Foley, MD, PhD [facility])

## 2016-07-01 NOTE — Patient Instructions (Addendum)
Continue clonazepam at current dose Call for any seizure activity Follow up in 6 months

## 2016-10-10 ENCOUNTER — Telehealth: Payer: Self-pay | Admitting: Neurology

## 2016-10-10 MED ORDER — CLONAZEPAM 1 MG PO TABS: 1.0000 mg | ORAL_TABLET | Freq: Three times a day (TID) | ORAL | 5 refills | Status: DC

## 2016-10-10 NOTE — Telephone Encounter (Signed)
Rx printed, awaiting signature. 

## 2016-10-10 NOTE — Telephone Encounter (Signed)
Patient called to check status of [facility]ill request [facility] Patient advised, Rx was faxed to pharmacy.

## 2016-10-10 NOTE — Telephone Encounter (Addendum)
Patient called to request refill of clonazePAM (KLONOPIN) 1 MG tablet, [facility] advised patient they haven't heard back from our office regarding this refill request that was faxed to our office yesterday. Patient only has enough left through Sunday, will run out.

## 2016-10-10 NOTE — Telephone Encounter (Signed)
Rx signed and faxed to pharmacy

## 2017-01-01 ENCOUNTER — Ambulatory Visit: Payer: 59 | Admitting: Nurse Practitioner

## 2017-01-08 ENCOUNTER — Ambulatory Visit (INDEPENDENT_AMBULATORY_CARE_PROVIDER_SITE_OTHER): Payer: 59 | Admitting: Nurse Practitioner

## 2017-01-08 ENCOUNTER — Encounter: Payer: Self-pay | Admitting: *Deleted

## 2017-01-08 ENCOUNTER — Encounter: Payer: Self-pay | Admitting: Nurse Practitioner

## 2017-01-08 VITALS — BP 138/95 | HR 68 | Ht 71.0 in | Wt 190.6 lb

## 2017-01-08 DIAGNOSIS — F410 Panic disorder [episodic paroxysmal anxiety] without agoraphobia: Secondary | ICD-10-CM | POA: Diagnosis not present

## 2017-01-08 DIAGNOSIS — R569 Unspecified convulsions: Secondary | ICD-10-CM

## 2017-01-08 MED ORDER — CLONAZEPAM 1 MG PO TABS: 1.0000 mg | ORAL_TABLET | Freq: Three times a day (TID) | ORAL | 5 refills | Status: DC

## 2017-01-08 NOTE — Progress Notes (Signed)
[facility]  PATIENT: Jay Francis DOB: 07/09/1986   REASON FOR VISIT: Follow-up for generalized epilepsy, panic disorder HISTORY FROM: Patient    HISTORY OF PRESENT ILLNESS:Jay Francis, 31 year old male returns for followup. He has a history of isolated seizure in September 2013 and another seizure event Sunday the 16th of July after missing 2 doses of his clonazepam . He also has panic disorder which is currently well controlled on clonazepam. He denies any side effects to the medication, no drowsiness or gait instability. He works full time. He returns for reevaluation. He does not have a primary care provider.   HISTORY:of a panic disorder. The patient has been placed on clonazepam, and he is tolerating the medication quite well. The patient is on 1 mg, taking 3 times daily .The patient indicates that overall, he feels as if the medication has changed his life, and he is able to function much better. The patient is tolerating medication well without drowsiness or gait instability. Patient has a history of isolated witnessed generalized seizure in September 2013. CT was unremarkable, normal EEG. No further seizure activity since that time   REVIEW OF SYSTEMS: Full 14 system review of systems performed and notable only for those listed, all others are neg:  Constitutional: neg  Cardiovascular: neg Ear/Nose/Throat: neg  Skin: neg Eyes: neg Respiratory: neg Gastroitestinal: neg  Hematology/Lymphatic: neg  Endocrine: neg Musculoskeletal:neg Allergy/Immunology: neg Neurological: Seizure  Psychiatric: Panic disorder Sleep : neg   ALLERGIES: Allergies  Allergen Reactions  . Amoxicillin Diarrhea and Nausea Only    HOME MEDICATIONS: Outpatient Medications Prior to Visit  Medication Sig Dispense Refill  . Cetirizine HCl (ZYRTEC PO) Take 1 tablet by mouth daily.    . clonazePAM (KLONOPIN) 1 MG tablet Take 1 tablet (1 mg total) by mouth 3 (three) times daily. 90  tablet 5  . Multiple Vitamin (MULTIVITAMIN) capsule Take 1 capsule by mouth daily.     No facility-administered medications prior to visit.     PAST MEDICAL HISTORY: Past Medical History:  Diagnosis Date  . Panic disorder without agoraphobia   . Seizures (HCC)     PAST SURGICAL HISTORY: Past Surgical History:  Procedure Laterality Date  . None      FAMILY HISTORY: Family History  Problem Relation Age of Onset  . Healthy Mother   . Healthy Father   . Seizures Maternal Grandfather     SOCIAL HISTORY: Social History   Social History  . Marital status: Single    Spouse name: N/A  . Number of children: 0  . Years of education: Associates   Occupational History  . LOGISTICS [employer]   Social History Main Topics  . Smoking status: Former Games developermoker  . Smokeless tobacco: Never Used  . Alcohol use No  . Drug use: No  . Sexual activity: Not on file   Other Topics Concern  . Not on file   Social History Narrative   Patient is single.    Patient has no children.    Patient is full-time.    Patient has an Associates degree.   Patient is right handed.      PHYSICAL EXAM  Vitals:   01/08/17 1106  BP: (!) 138/95  Pulse: 68  Weight: 190 lb 9.6 oz (86.5 kg)  Height: 5\' 11"  (1.803 m)   Body mass index is 26.58 kg/m. Generalized: Well developed, in no acute distress  Head: normocephalic and atraumatic,. Oropharynx benign  Neck: Supple,  Neurological examination  Mentation: Alert oriented to time, place, history taking. Attention span and concentration appropriate. Recent and remote memory intact. Follows all commands speech and language fluent.   Cranial nerve II-XII: Pupils were equal round reactive to light extraocular movements were full, visual field were full on confrontational test. Facial sensation and strength were normal. hearing was intact to finger rubbing bilaterally. Uvula tongue midline. head turning and shoulder shrug were normal  and symmetric.Tongue protrusion into cheek strength was normal. Motor: normal bulk and tone, full strength in the BUE, BLE, fine finger movements normal, no pronator drift. No focal weakness Sensory: normal and symmetric to light touch, pinprick, and Vibration, in the upper and lower extremities Coordination: finger-nose-finger, heel-to-shin bilaterally, no dysmetria Reflexes: Brachioradialis 2/2, biceps 2/2, triceps 2/2, patellar 2/2, Achilles 2/2, plantar responses were flexor bilaterally. Gait and Station: Rising up from seated position without assistance, normal stance, moderate stride, good arm swing, smooth turning, able to perform tiptoe, and heel walking without difficulty. Tandem gait is steady   DIAGNOSTIC DATA (LABS, IMAGING, TESTING) -  ASSESSMENT AND PLAN 31 y.o. year old male has a past medical history of isolated seizure event and another seizure on 06/29/16 after missing 2 doses of his clonazepam, did not eat and  had not slept well the night before.  Panic disorder without agoraphobia. here to follow-up. His panic disorder is under control with Klonopin.   Continue clonazepam at current dose will refill Call for any further seizure activity Follow-up in 6 months Nilda Riggs, [facility], [facility], APRN  [facility] [address] [place], [place] [address] [phone]  I

## 2017-01-08 NOTE — Patient Instructions (Signed)
Continue clonazepam at current dose  Call for any seizure activity worsening of panic disorder Follow-up in 6 months

## 2017-01-08 NOTE — Progress Notes (Signed)
I have read the note, and I agree with the clinical assessment and plan.  Jay Francis   

## 2017-01-08 NOTE — Progress Notes (Signed)
Faxed printed/signed rx clonazepam to pt pharmacy. Fax: [phone]: [phone]. Received confirmation.

## 2017-03-27 ENCOUNTER — Ambulatory Visit: Payer: 59 | Admitting: Nurse Practitioner

## 2017-04-01 ENCOUNTER — Telehealth: Payer: Self-pay | Admitting: Nurse Practitioner

## 2017-04-01 MED ORDER — CLONAZEPAM 1 MG PO TABS: 1.0000 mg | ORAL_TABLET | Freq: Three times a day (TID) | ORAL | 5 refills | Status: DC

## 2017-04-01 NOTE — Telephone Encounter (Signed)
Patient called office requesting refill for clonazePAM (KLONOPIN) 1 MG tablet. Pharmacy-  [facility] [address] in [place]

## 2017-04-01 NOTE — Addendum Note (Signed)
Addended by: [place] Pink on: 04/01/2017 04:23 PM   Modules accepted: Orders

## 2017-04-01 NOTE — Telephone Encounter (Signed)
Spoke with patient to inform him it was filled in Jan for 6 months. Patient stated pharmacy told him there are no refills. He also has moved and is using a different pharmacy. Prescription d/c at [facility], ordered at [facility]. Script ready for Dr Anne Hahn' sig.

## 2017-04-01 NOTE — Telephone Encounter (Signed)
Faxed printed/signed rx to pt pharmacy as requested. Fax: [phone]. Received confirmation.

## 2017-05-29 DIAGNOSIS — G40409 Other generalized epilepsy and epileptic syndromes, not intractable, without status epilepticus: Secondary | ICD-10-CM | POA: Insufficient documentation

## 2017-06-29 ENCOUNTER — Ambulatory Visit (INDEPENDENT_AMBULATORY_CARE_PROVIDER_SITE_OTHER): Payer: 59 | Admitting: Physician Assistant

## 2017-06-29 ENCOUNTER — Encounter: Payer: Self-pay | Admitting: Physician Assistant

## 2017-06-29 VITALS — BP 122/84 | HR 80 | Resp 16 | Ht 69.0 in | Wt 192.0 lb

## 2017-06-29 DIAGNOSIS — Z131 Encounter for screening for diabetes mellitus: Secondary | ICD-10-CM

## 2017-06-29 DIAGNOSIS — Z0000 Encounter for general adult medical examination without abnormal findings: Secondary | ICD-10-CM

## 2017-06-29 DIAGNOSIS — Z13220 Encounter for screening for lipoid disorders: Secondary | ICD-10-CM

## 2017-06-29 DIAGNOSIS — J45909 Unspecified asthma, uncomplicated: Secondary | ICD-10-CM | POA: Diagnosis not present

## 2017-06-29 DIAGNOSIS — Z1329 Encounter for screening for other suspected endocrine disorder: Secondary | ICD-10-CM

## 2017-06-29 MED ORDER — FLUTICASONE FUROATE-VILANTEROL 100-25 MCG/INH IN AEPB: 1.0000 | INHALATION_SPRAY | Freq: Every day | RESPIRATORY_TRACT | 0 refills | Status: DC

## 2017-06-29 NOTE — Progress Notes (Signed)
Patient: Jay Francis Male    DOB: 11/03/1986   31 y.o.   MRN: [id number] Visit Date: 06/29/2017  Today's Provider: Trey SailorsAdriana M Pollak, PA-C   Chief Complaint  Patient presents with  . Establish Care   Subjective:     Jay Francis is a 31 y/o man presenting today to establish care and for annual physical. His prior PCP was Dr. Jonette EvaLazo at [facility] in [place], [place]. He hasn't been there in 10+ years.   He lives in [place], [place] with his girlfriend. They have been together two years, relationship going well. No children. Sexually active with single male partner, his girlfriend. No concern for STI. Does not and has not ever smoked. No alcohol or drugs.  He has a history of two seizures and anxiety. Takes 1 mg Klonopin for this. [facility].  He also has breathing issues. He has history of recurrent bronchitis, reports he has walking pneumonia x 2 times, seen at urgent care. He scribes wheezing and SOB, intermittent chest tightness, especially with respiratory infections. Uses albuterol inhaler PRN as filled by urgent care.  Breathing Problem  He complains of cough (Only occasionally), difficulty breathing, shortness of breath and wheezing. This is a chronic problem. Pertinent negatives include no ear pain, postnasal drip, rhinorrhea, sneezing, sore throat or trouble swallowing. His past medical history is significant for bronchitis. Past medical history comments: Sinusitis .      Allergies  Allergen Reactions  . Amoxicillin Diarrhea and Nausea Only     Current Outpatient Prescriptions:  .  Cetirizine HCl (ZYRTEC PO), Take 1 tablet by mouth daily., Disp: , Rfl:  .  clonazePAM (KLONOPIN) 1 MG tablet, Take 1 tablet (1 mg total) by mouth 3 (three) times daily., Disp: 90 tablet, Rfl: 5 .  fluticasone (FLONASE) 50 MCG/ACT nasal spray, INSTILL 1 SPRAY IEN QD, Disp: , Rfl: 0 .  Multiple Vitamin (MULTIVITAMIN) capsule, Take 1 capsule by mouth  daily., Disp: , Rfl:  .  VENTOLIN HFA 108 (90 Base) MCG/ACT inhaler, , Disp: , Rfl:   Review of Systems  Constitutional: Negative.   HENT: Positive for dental problem (Back teeth pain on right side. Pt has been to the dentist and his teeth are fine.) and sinus pressure. Negative for congestion, drooling, ear discharge, ear pain, facial swelling, hearing loss, mouth sores, nosebleeds, postnasal drip, rhinorrhea, sinus pain, sneezing, sore throat, tinnitus, trouble swallowing and voice change.   Eyes: Negative.   Respiratory: Positive for cough (Only occasionally), chest tightness, shortness of breath and wheezing. Negative for apnea, choking and stridor.   Cardiovascular: Negative.   Gastrointestinal: Negative.   Endocrine: Negative.   Genitourinary: Negative.   Musculoskeletal: Negative.   Skin: Negative.   Allergic/Immunologic: Positive for environmental allergies.  Neurological: Positive for seizures.  Hematological: Negative.   Psychiatric/Behavioral: Positive for decreased concentration and sleep disturbance. The patient is nervous/anxious.     Social History  Substance Use Topics  . Smoking status: Never Smoker  . Smokeless tobacco: Never Used  . Alcohol use No   Objective:   BP 122/84 (BP Location: Left Arm, Patient Position: Sitting, Cuff Size: Normal)   Pulse 80   Resp 16   Ht 5\' 9"  (1.753 m)   Wt 192 lb (87.1 kg)   SpO2 99%   BMI 28.35 kg/m  Vitals:   06/29/17 1415  BP: 122/84  Pulse: 80  Resp: 16  SpO2: 99%  Weight: 192 lb (87.1  kg)  Height: 5\' 9"  (1.753 m)     Physical Exam  Constitutional: He is oriented to person, place, and time. He appears well-developed and well-nourished.  HENT:  Right Ear: Tympanic membrane and external ear normal.  Left Ear: Tympanic membrane and external ear normal.  Mouth/Throat: Oropharynx is clear and moist. No oropharyngeal exudate, posterior oropharyngeal edema or posterior oropharyngeal erythema.  Eyes: Conjunctivae are  normal. Right eye exhibits no discharge. Left eye exhibits no discharge.  Neck: Neck supple. No thyromegaly present.  Cardiovascular: Normal rate and regular rhythm.   Pulmonary/Chest: Effort normal and breath sounds normal.  Abdominal: Soft. Bowel sounds are normal.  Lymphadenopathy:    He has no cervical adenopathy.  Neurological: He is alert and oriented to person, place, and time.  Skin: Skin is warm and dry.  Psychiatric: He has a normal mood and affect. His behavior is normal.        Assessment & Plan:     1. Encounter for medical examination to establish care  - CBC with Differential/Platelet  2. Annual physical exam   3. Lipid screening  - Lipid panel  4. Diabetes mellitus screening  - Comprehensive metabolic panel  5. Thyroid disorder screening  - TSH  6. Asthma, unspecified asthma severity, unspecified whether complicated, unspecified whether persistent  Ran out of time for spirometry today. Offered patient trial of breo as symptoms sound consistent with asthma. Can refer if he desires.   - fluticasone furoate-vilanterol (BREO ELLIPTA) 100-25 MCG/INH AEPB; Inhale 1 puff into the lungs daily.  Dispense: 28 each; Refill: 0   Return in about 1 year (around 06/29/2018) for CPEnn.  The entirety of the information documented in the History of Present Illness, Review of Systems and Physical Exam were personally obtained by me. Portions of this information were initially documented by Kavin Leech, CMA and reviewed by me for thoroughness and accuracy.         Trey Sailors, PA-C  [facility]

## 2017-06-29 NOTE — Patient Instructions (Signed)

## 2017-07-01 LAB — CBC WITH DIFFERENTIAL/PLATELET
Basophils Absolute: 0 10*3/uL (ref 0.0–0.2)
Basos: 1 %
EOS (ABSOLUTE): 0 10*3/uL (ref 0.0–0.4)
Eos: 0 %
Hematocrit: 44.2 % (ref 37.5–51.0)
Hemoglobin: 14.9 g/dL (ref 13.0–17.7)
Immature Grans (Abs): 0 10*3/uL (ref 0.0–0.1)
Immature Granulocytes: 1 %
Lymphocytes Absolute: 2.1 10*3/uL (ref 0.7–3.1)
Lymphs: 26 %
MCH: 28.8 pg (ref 26.6–33.0)
MCHC: 33.7 g/dL (ref 31.5–35.7)
MCV: 86 fL (ref 79–97)
Monocytes Absolute: 0.7 10*3/uL (ref 0.1–0.9)
Monocytes: 9 %
Neutrophils Absolute: 5.2 10*3/uL (ref 1.4–7.0)
Neutrophils: 63 %
Platelets: 239 10*3/uL (ref 150–379)
RBC: 5.17 x10E6/uL (ref 4.14–5.80)
RDW: 12.8 % (ref 12.3–15.4)
WBC: 8 10*3/uL (ref 3.4–10.8)

## 2017-07-01 LAB — COMPREHENSIVE METABOLIC PANEL
ALT: 35 IU/L (ref 0–44)
AST: 21 IU/L (ref 0–40)
Albumin/Globulin Ratio: 2.1 (ref 1.2–2.2)
Albumin: 5.3 g/dL (ref 3.5–5.5)
Alkaline Phosphatase: 57 IU/L (ref 39–117)
BUN/Creatinine Ratio: 12 (ref 9–20)
BUN: 14 mg/dL (ref 6–20)
Bilirubin Total: 0.3 mg/dL (ref 0.0–1.2)
CO2: 20 mmol/L (ref 20–29)
Calcium: 10.4 mg/dL — ABNORMAL HIGH (ref 8.7–10.2)
Chloride: 100 mmol/L (ref 96–106)
Creatinine, Ser: 1.18 mg/dL (ref 0.76–1.27)
GFR calc Af Amer: 94 mL/min/{1.73_m2} (ref >59)
GFR calc non Af Amer: 82 mL/min/{1.73_m2} (ref >59)
Globulin, Total: 2.5 g/dL (ref 1.5–4.5)
Glucose: 95 mg/dL (ref 65–99)
Potassium: 4 mmol/L (ref 3.5–5.2)
Sodium: 142 mmol/L (ref 134–144)
Total Protein: 7.8 g/dL (ref 6.0–8.5)

## 2017-07-01 LAB — LIPID PANEL
Chol/HDL Ratio: 6.8 ratio — ABNORMAL HIGH (ref 0.0–5.0)
Cholesterol, Total: 250 mg/dL — ABNORMAL HIGH (ref 100–199)
HDL: 37 mg/dL — ABNORMAL LOW (ref >39)
LDL Calculated: 174 mg/dL — ABNORMAL HIGH (ref 0–99)
Triglycerides: 194 mg/dL — ABNORMAL HIGH (ref 0–149)
VLDL Cholesterol Cal: 39 mg/dL (ref 5–40)

## 2017-07-01 LAB — TSH: TSH: 1.37 u[IU]/mL (ref 0.450–4.500)

## 2017-07-02 ENCOUNTER — Telehealth: Payer: Self-pay

## 2017-07-02 DIAGNOSIS — J45909 Unspecified asthma, uncomplicated: Secondary | ICD-10-CM

## 2017-07-02 MED ORDER — FLUTICASONE FUROATE-VILANTEROL 100-25 MCG/INH IN AEPB: 1.0000 | INHALATION_SPRAY | Freq: Every day | RESPIRATORY_TRACT | 5 refills | Status: DC

## 2017-07-02 NOTE — Telephone Encounter (Signed)
Sent in [place]. Glad to hear it's helping.

## 2017-07-02 NOTE — Telephone Encounter (Signed)
[facility] you know [facility], he can tell a big difference already and wanted to see if he can go ahead and get a RX for that please. To [facility]

## 2017-07-03 ENCOUNTER — Telehealth: Payer: Self-pay | Admitting: Physician Assistant

## 2017-07-03 ENCOUNTER — Encounter: Payer: Self-pay | Admitting: Physician Assistant

## 2017-07-03 NOTE — Telephone Encounter (Signed)
ROI faxed to [facility] ( DR. Jonette EvaLazo )   Received records from [facility] forwarded 20 pages to Dr. Osvaldo AngstAdriana Pollak

## 2017-07-09 ENCOUNTER — Encounter: Payer: Self-pay | Admitting: Nurse Practitioner

## 2017-07-09 ENCOUNTER — Ambulatory Visit (INDEPENDENT_AMBULATORY_CARE_PROVIDER_SITE_OTHER): Payer: 59 | Admitting: Nurse Practitioner

## 2017-07-09 VITALS — BP 117/79 | HR 84 | Ht 69.0 in | Wt 192.0 lb

## 2017-07-09 DIAGNOSIS — G40309 Generalized idiopathic epilepsy and epileptic syndromes, not intractable, without status epilepticus: Secondary | ICD-10-CM

## 2017-07-09 DIAGNOSIS — F410 Panic disorder [episodic paroxysmal anxiety] without agoraphobia: Secondary | ICD-10-CM

## 2017-07-09 NOTE — Progress Notes (Signed)
[facility]  PATIENT: Jay Francis DOB: 12/23/1985   REASON FOR VISIT: Follow-up for generalized epilepsy, panic disorder HISTORY FROM: Patient    HISTORY OF PRESENT ILLNESS:[name], 31 year old male returns for followup. He has a history of isolated seizure in September 2013 and another seizure event Sunday the 16th of July 2017 after missing 2 doses of his clonazepam . He also has panic disorder which is currently well controlled on clonazepam. He denies any side effects to the medication, no drowsiness or gait instability. He works full time. He returns for reevaluation. He has finally obtained  primary care provider due to diagnosis of asthma.   HISTORY:of a panic disorder. The patient has been placed on clonazepam, and he is tolerating the medication quite well. The patient is on 1 mg, taking 3 times daily .The patient indicates that overall, he feels as if the medication has changed his life, and he is able to function much better. The patient is tolerating medication well without drowsiness or gait instability. Patient has a history of isolated witnessed generalized seizure in September 2013. CT was unremarkable, normal EEG. No further seizure activity since that time   REVIEW OF SYSTEMS: Full 14 system review of systems performed and notable only for those listed, all others are neg:  Constitutional: neg  Cardiovascular: neg Ear/Nose/Throat: neg  Skin: neg Eyes: neg Respiratory: neg Gastroitestinal: neg  Hematology/Lymphatic: neg  Endocrine: neg Musculoskeletal:neg Allergy/Immunology: neg Neurological: Seizure  Psychiatric: Panic disorder Sleep : neg   ALLERGIES: Allergies  Allergen Reactions  . Amoxicillin Diarrhea and Nausea Only    HOME MEDICATIONS: Outpatient Medications Prior to Visit  Medication Sig Dispense Refill  . Cetirizine HCl (ZYRTEC PO) Take 1 tablet by mouth daily.    . clonazePAM (KLONOPIN) 1 MG tablet Take 1 tablet (1 mg  total) by mouth 3 (three) times daily. 90 tablet 5  . fluticasone (FLONASE) 50 MCG/ACT nasal spray INSTILL 1 SPRAY IEN QD  0  . fluticasone furoate-vilanterol (BREO ELLIPTA) 100-25 MCG/INH AEPB Inhale 1 puff into the lungs daily. 28 each 5  . Multiple Vitamin (MULTIVITAMIN) capsule Take 1 capsule by mouth daily.    . VENTOLIN HFA 108 (90 Base) MCG/ACT inhaler      No facility-administered medications prior to visit.     PAST MEDICAL HISTORY: Past Medical History:  Diagnosis Date  . Panic disorder without agoraphobia   . Seizures (HCC)     PAST SURGICAL HISTORY: Past Surgical History:  Procedure Laterality Date  . None    . WISDOM TOOTH EXTRACTION      FAMILY HISTORY: Family History  Problem Relation Age of Onset  . Healthy Mother   . Allergies Mother   . Melanoma Mother   . Healthy Father   . Melanoma Father   . Seizures Maternal Grandfather   . Healthy Brother     SOCIAL HISTORY: Social History   Social History  . Marital status: Single    Spouse name: N/A  . Number of children: 0  . Years of education: Associates   Occupational History  . LOGISTICS [employer]   Social History Main Topics  . Smoking status: Never Smoker  . Smokeless tobacco: Never Used  . Alcohol use No  . Drug use: No  . Sexual activity: Yes    Partners: Female   Other Topics Concern  . Not on file   Social History Narrative   Patient is single.    Patient has no children.  Patient is full-time.    Patient has an Associates degree.   Patient is right handed.      PHYSICAL EXAM  Vitals:   07/09/17 1518  BP: 117/79  Pulse: 84  Weight: 192 lb (87.1 kg)  Height: 5\' 9"  (1.753 m)   Body mass index is 28.35 kg/m. Generalized: Well developed, in no acute distress  Head: normocephalic and atraumatic,. Oropharynx benign  Neck: Supple,  Neurological examination   Mentation: Alert oriented to time, place, history taking. Attention span and concentration  appropriate. Recent and remote memory intact. Follows all commands speech and language fluent.   Cranial nerve II-XII: Pupils were equal round reactive to light extraocular movements were full, visual field were full on confrontational test. Facial sensation and strength were normal. hearing was intact to finger rubbing bilaterally. Uvula tongue midline. head turning and shoulder shrug were normal and symmetric.Tongue protrusion into cheek strength was normal. Motor: normal bulk and tone, full strength in the BUE, BLE, fine finger movements normal, no pronator drift. No focal weakness Sensory: normal and symmetric to light touch,  in the upper and lower extremities Coordination: finger-nose-finger, heel-to-shin bilaterally, no dysmetria Reflexes: Brachioradialis 2/2, biceps 2/2, triceps 2/2, patellar 2/2, Achilles 2/2, plantar responses were flexor bilaterally. Gait and Station: Rising up from seated position without assistance, normal stance, moderate stride, good arm swing, smooth turning, able to perform tiptoe, and heel walking without difficulty. Tandem gait is steady   DIAGNOSTIC DATA (LABS, IMAGING, TESTING) -  ASSESSMENT AND PLAN 31 y.o. year old male has a past medical history of isolated seizure event and another seizure on 06/29/16 after missing 2 doses of his clonazepam, did not eat and  had not slept well the night before.  Panic disorder without agoraphobia. here to follow-up. His panic disorder is under control with Klonopin. Recent diagnosis of asthma   PLAN:Continue clonazepam at current dose does not need refills Call for any further seizure activity Follow-up yearly Nilda RiggsNancy Carolyn Purvis Sidle, [facility], [facility], APRN  [facility] [address], [address] [place], [place] [address] [phone]  I

## 2017-07-09 NOTE — Progress Notes (Signed)
I have read the note, and I agree with the clinical assessment and plan.  WILLIS,CHARLES KEITH   

## 2017-07-09 NOTE — Patient Instructions (Signed)
Continue clonazepam at current dose does not need refills Call for any further seizure activity Follow-up yearly

## 2017-08-12 ENCOUNTER — Other Ambulatory Visit: Payer: Self-pay | Admitting: Physician Assistant

## 2017-08-12 NOTE — Telephone Encounter (Signed)
Pt needs refill on his rescue inhaler. [facility] [address]  Pts call back is [phone]  Thanks Fortune Brandsteri

## 2017-08-13 MED ORDER — VENTOLIN HFA 108 (90 BASE) MCG/ACT IN AERS: 2.0000 | INHALATION_SPRAY | Freq: Four times a day (QID) | RESPIRATORY_TRACT | 3 refills | Status: DC | PRN

## 2017-08-25 ENCOUNTER — Telehealth: Payer: Self-pay | Admitting: Nurse Practitioner

## 2017-08-25 ENCOUNTER — Other Ambulatory Visit: Payer: Self-pay | Admitting: Nurse Practitioner

## 2017-08-25 MED ORDER — CLONAZEPAM 1 MG PO TABS: 1.0000 mg | ORAL_TABLET | Freq: Three times a day (TID) | ORAL | 5 refills | Status: DC

## 2017-08-25 NOTE — Telephone Encounter (Signed)
Clonazepam refill Rx [facility]ed to [facility] [place].

## 2017-08-25 NOTE — Telephone Encounter (Signed)
Patient requesting refill of clonazePAM (KLONOPIN) 1 MG tablet called to [facility] on [address] in [place].

## 2017-08-31 ENCOUNTER — Encounter: Payer: Self-pay | Admitting: Family Medicine

## 2017-08-31 ENCOUNTER — Ambulatory Visit (INDEPENDENT_AMBULATORY_CARE_PROVIDER_SITE_OTHER): Payer: 59 | Admitting: Family Medicine

## 2017-08-31 VITALS — BP 126/94 | HR 77 | Temp 98.8°F | Resp 16 | Wt 190.6 lb

## 2017-08-31 DIAGNOSIS — J45909 Unspecified asthma, uncomplicated: Secondary | ICD-10-CM | POA: Diagnosis not present

## 2017-08-31 DIAGNOSIS — J069 Acute upper respiratory infection, unspecified: Secondary | ICD-10-CM | POA: Diagnosis not present

## 2017-08-31 NOTE — Progress Notes (Signed)
Subjective:     Patient ID: Jay Francis, male   DOB: [birth year], 31 y.o.   MRN: [id number]  HPI  Chief Complaint  Patient presents with  . URI    Patient comes in office today with cold like symptoms for the past 3 days. Patient reports symptoms of sinus pressure, swollen lymph node, chest tightness, cough and wheezing.Patient reports taking otc Mucinex for relief.   Has been taking Mucinex and reports compliance with his respiratory medication.Since ill has been using Albuterol twice daily. Reports chills but no fever.   Review of Systems     Objective:   Physical Exam  Constitutional: He appears well-developed and well-nourished. No distress.  Ears: T.M's intact without inflammation Sinuses: mild right maxillary sinus tenderness Throat: Tonsils are absent Neck: right anterior cervical tenderness Lungs: clear     Assessment:    1. Viral upper respiratory tract infection  2. Asthma, unspecified asthma severity, unspecified whether complicated, unspecified whether persistent: controlled with current medication     Plan:    Discussed use of Mucinex D for congestion, Delsym for cough, and Benadryl for postnasal drainage. Excuse for 917-09/04/17. Call 9/21 if not improving.

## 2017-08-31 NOTE — Patient Instructions (Signed)
Discussed use of Mucinex D for congestion, Benadryl at night to help sleep and Delsym for cough. Schedule albuterol at least twice a day. Call me Friday if not improving.

## 2017-09-02 ENCOUNTER — Telehealth: Payer: Self-pay | Admitting: Physician Assistant

## 2017-09-02 NOTE — Telephone Encounter (Signed)
Still in viral window. If he's not feeling better on Friday, 9/21 then we can send in antibiotic on Friday.

## 2017-09-02 NOTE — Telephone Encounter (Signed)
Pt states she was seen on Monday and seen Nadine Counts.  Pt states she is not feeling any better.  Pt still has sinus congestion, ear pain, face and throat hurts.  Pt states he has some chest congestion and cough. Pt is asking if he can get a Rx for this.   [facility] [address].  [phone]

## 2017-09-04 NOTE — Telephone Encounter (Signed)
Pt advised.

## 2017-11-19 ENCOUNTER — Emergency Department (HOSPITAL_COMMUNITY)
Admission: EM | Admit: 2017-11-19 | Discharge: 2017-11-19 | Disposition: A | Discharge status: Home / Self Care | Payer: 59 | Attending: Emergency Medicine | Admitting: Emergency Medicine

## 2017-11-19 ENCOUNTER — Other Ambulatory Visit: Payer: Self-pay

## 2017-11-19 ENCOUNTER — Emergency Department (HOSPITAL_COMMUNITY): Payer: 59

## 2017-11-19 ENCOUNTER — Telehealth: Payer: Self-pay

## 2017-11-19 ENCOUNTER — Encounter (HOSPITAL_COMMUNITY): Payer: Self-pay | Admitting: *Deleted

## 2017-11-19 DIAGNOSIS — R079 Chest pain, unspecified: Secondary | ICD-10-CM | POA: Diagnosis present

## 2017-11-19 DIAGNOSIS — Z79899 Other long term (current) drug therapy: Secondary | ICD-10-CM | POA: Insufficient documentation

## 2017-11-19 DIAGNOSIS — R0789 Other chest pain: Secondary | ICD-10-CM

## 2017-11-19 LAB — I-STAT TROPONIN, ED
TROPONIN I, POC: 0 ng/mL (ref 0.00–0.08)
Troponin i, poc: 0 ng/mL (ref 0.00–0.08)

## 2017-11-19 LAB — CBC
HCT: 43.5 % (ref 39.0–52.0)
Hemoglobin: 15.1 g/dL (ref 13.0–17.0)
MCH: 29.8 pg (ref 26.0–34.0)
MCHC: 34.7 g/dL (ref 30.0–36.0)
MCV: 85.8 fL (ref 78.0–100.0)
Platelets: 251 10*3/uL (ref 150–400)
RBC: 5.07 MIL/uL (ref 4.22–5.81)
RDW: 12 % (ref 11.5–15.5)
WBC: 7.1 10*3/uL (ref 4.0–10.5)

## 2017-11-19 LAB — BASIC METABOLIC PANEL
ANION GAP: 10 (ref 5–15)
BUN: 13 mg/dL (ref 6–20)
CALCIUM: 9.8 mg/dL (ref 8.9–10.3)
CO2: 26 mmol/L (ref 22–32)
Chloride: 100 mmol/L — ABNORMAL LOW (ref 101–111)
Creatinine, Ser: 1.04 mg/dL (ref 0.61–1.24)
GFR calc Af Amer: >60 mL/min (ref >60)
Glucose, Bld: 130 mg/dL — ABNORMAL HIGH (ref 65–99)
Potassium: 4 mmol/L (ref 3.5–5.1)
Sodium: 136 mmol/L (ref 135–145)

## 2017-11-19 LAB — D-DIMER, QUANTITATIVE (NOT AT ARMC): D-Dimer, Quant: <0.27 ug/mL-FEU (ref 0.00–0.50)

## 2017-11-19 MED ORDER — IBUPROFEN 200 MG PO TABS
600.0000 mg | ORAL_TABLET | Freq: Once | ORAL | Status: AC
  Administered 2017-11-19: 600 mg via ORAL
  Filled 2017-11-19: qty 1

## 2017-11-19 MED ORDER — IBUPROFEN 600 MG PO TABS: 600.0000 mg | ORAL_TABLET | Freq: Four times a day (QID) | ORAL | 0 refills | Status: DC | PRN

## 2017-11-19 MED ORDER — SODIUM CHLORIDE 0.9 % IV BOLUS (SEPSIS)
1000.0000 mL | Freq: Once | INTRAVENOUS | Status: AC
  Administered 2017-11-19: 1000 mL via INTRAVENOUS

## 2017-11-19 NOTE — ED Triage Notes (Signed)
To ED for eval of mid-sternal CP for past couple days. Pt states he has hx of pericarditis and feels similar. No n/v. Appears in nad.

## 2017-11-19 NOTE — Telephone Encounter (Signed)
Noted  

## 2017-11-19 NOTE — ED Provider Notes (Signed)
[facility] EMERGENCY DEPARTMENT Provider Note   CSN: [id number] Arrival date & time: 11/19/17  0831     History   Chief Complaint Chief Complaint  Patient presents with  . Chest Pain    HPI Jay Francis is a 31 y.o. male with history of seizures, panic disorder who presents with a one week history of midsternal chest pain.  It has been constant.  He describes it as a tightness that becomes sharp with deep breath.  It is reproducible.  He reports history of pericarditis 15 years ago, which feels similar, but the pericarditis pain was much worse.  He denies any nausea or vomiting.  Denies worsening symptoms on exertion.  He has had some shortness of breath with the pain.  He seems to notice it a lot at work.  He has been under a lot of stress lately.  It feels different than his typical panic attacks.  He denies any abdominal pain, lightheadedness or dizziness.  He denies any recent long trips, surgeries, known cancer, new leg pain or swelling, history of blood clots.  HPI  Past Medical History:  Diagnosis Date  . Panic disorder without agoraphobia   . Seizures [facility](HCC)     Patient Active Problem List   Diagnosis Date Noted  . Seizures (HCC) 01/08/2017  . Panic disorder without agoraphobia 08/09/2013  . 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 None    . WISDOM TOOTH EXTRACTION         Home Medications    Prior to Admission medications   Medication Sig Start Date End Date Taking? Authorizing Provider  clonazePAM (KLONOPIN) 1 MG tablet Take 1 tablet (1 mg total) by mouth 3 (three) times daily. 08/25/17  Yes Nilda RiggsMartin, Nancy Carolyn, NP  fluticasone furoate-vilanterol (BREO ELLIPTA) 100-25 MCG/INH AEPB Inhale 1 puff into the lungs daily. 07/02/17  Yes Trey SailorsPollak, Adriana M, PA-C  loratadine (CLARITIN) 10 MG tablet Take 10 mg by mouth daily.   Yes [provider]  Multiple Vitamin  (MULTIVITAMIN) capsule Take 1 capsule by mouth daily.   Yes [provider]  Probiotic Product (PRO-BIOTIC BLEND PO) Take 1 tablet by mouth daily.   Yes [provider]  VENTOLIN HFA 108 (90 Base) MCG/ACT inhaler Inhale 2 puffs into the lungs every 6 (six) hours as needed for wheezing or shortness of breath. 08/13/17  Yes Osvaldo AngstPollak, Adriana M, PA-C  ibuprofen (ADVIL,MOTRIN) 600 MG tablet Take 1 tablet (600 mg total) by mouth every 6 (six) hours as needed. 11/19/17   Emi HolesLaw, Demichael Traum M, PA-C    Family History Family History  Problem Relation Age of Onset  . Healthy Mother   . Allergies Mother   . Melanoma Mother   . Healthy Father   . Melanoma Father   . Seizures Maternal Grandfather   . Healthy Brother     Social History Social History   Tobacco Use  . Smoking status: Never Smoker  . Smokeless tobacco: Never Used  Substance Use Topics  . Alcohol use: No  . Drug use: No     Allergies   Amoxicillin   Review of Systems Review of Systems  Constitutional: Negative for chills and fever.  HENT: Negative for facial swelling and sore throat.   Respiratory: Positive for shortness of breath.   Cardiovascular: Positive for chest pain.  Gastrointestinal: Negative for abdominal pain, nausea and vomiting.  Genitourinary: Negative for dysuria.  Musculoskeletal: Negative for  back pain.  Skin: Negative for rash and wound.  Neurological: Negative for headaches.  Psychiatric/Behavioral: The patient is not nervous/anxious.      Physical Exam Updated Vital Signs BP 114/78   Pulse 63   Temp 97.7 F (36.5 C) (Oral)   Resp 18   SpO2 100%   Physical Exam  Constitutional: He appears well-developed and well-nourished. No distress.  HENT:  Head: Normocephalic and atraumatic.  Mouth/Throat: Oropharynx is clear and moist. No oropharyngeal exudate.  Eyes: Conjunctivae are normal. Pupils are equal, round, and reactive to light. Right eye exhibits no discharge. Left eye  exhibits no discharge. No scleral icterus.  Neck: Normal range of motion. Neck supple. No thyromegaly present.  Cardiovascular: Normal rate, regular rhythm, normal heart sounds and intact distal pulses. Exam reveals no gallop and no friction rub.  No murmur heard. Pulmonary/Chest: Effort normal and breath sounds normal. No stridor. No respiratory distress. He has no wheezes. He has no rales. He exhibits tenderness.    Abdominal: Soft. Bowel sounds are normal. He exhibits no distension. There is no tenderness. There is no rebound and no guarding.  Musculoskeletal: He exhibits no edema.  Lymphadenopathy:    He has no cervical adenopathy.  Neurological: He is alert. Coordination normal.  Skin: Skin is warm and dry. No rash noted. He is not diaphoretic. No pallor.  Psychiatric: He has a normal mood and affect.  Nursing note and vitals reviewed.    ED Treatments / Results  Labs (all labs ordered are listed, but only abnormal results are displayed) Labs Reviewed  BASIC METABOLIC PANEL - Abnormal; Notable for the following components:      Result Value   Chloride 100 (*)    Glucose, Bld 130 (*)    All other components within normal limits  CBC  D-DIMER, QUANTITATIVE (NOT AT [facility])  I-STAT TROPONIN, ED  I-STAT TROPONIN, ED    EKG  EKG Interpretation  Date/Time:  Thursday November 19 2017 08:37:22 EST Ventricular Rate:  94 PR Interval:  160 QRS Duration: 84 QT Interval:  342 QTC Calculation: 427 R Axis:   69 Text Interpretation:  Normal sinus rhythm with sinus arrhythmia Normal ECG No previous ECGs available Confirmed by Frederick PeersLittle, Rachel [phone]([id number]) on 11/19/2017 12:29:53 PM       Radiology Dg Chest 2 View  Result Date: 11/19/2017 CLINICAL DATA:  Mid sternal chest pain EXAM: CHEST  2 VIEW COMPARISON:  None. FINDINGS: Heart and mediastinal contours are within normal limits. No focal opacities or effusions. No acute bony abnormality. IMPRESSION: No active cardiopulmonary disease.  Electronically Signed   By: Charlett NoseKevin  Dover M.D.   On: 11/19/2017 09:25    Procedures Procedures (including critical care time)  Medications Ordered in ED Medications  ibuprofen (ADVIL,MOTRIN) tablet 600 mg (600 mg Oral Given 11/19/17 1222)  sodium chloride 0.9 % bolus 1,000 mL (1,000 mLs Intravenous New Bag/Given 11/19/17 1219)     Initial Impression / Assessment and Plan / ED Course  I have reviewed the triage vital signs and the nursing notes.  Pertinent labs & imaging results that were available during my care of the patient were reviewed by me and considered in my medical decision making (see chart for details).     Patient had vasovagal episode while IV was being placed.  His blood pressure and heart rate decreased.  Patient passed out briefly and woke up feeling sweaty.  IV fluids were started.  Patient recovered completely after vagal maneuvers and fluids.  Patient with  suspected chest wall pain versus chest pain related to anxiety.  Delta troponin is negative.  D-dimer is negative.  Labs unremarkable.  Chest x-ray is normal.  EKG shows NSR with sinus arrhythmia.  Patient symptoms improved with 600 mg ibuprofen in the ED.  Will discharge home with regular dosing for the next week.  Follow-up to PCP.  Return precautions discussed.  Patient understands and agrees with plan.  Patient vitals stable and discharged in satisfactory condition.  Final Clinical Impressions(s) / ED Diagnoses   Final diagnoses:  Chest wall pain    ED Discharge Orders        Ordered    ibuprofen (ADVIL,MOTRIN) 600 MG tablet  Every 6 hours PRN     11/19/17 1309       Emi Holes, [place] 11/19/17 1336    Little, Ambrose Finland, MD 11/19/17 2035

## 2017-11-19 NOTE — ED Notes (Signed)
Patient verbalized understanding of discharge instructions and denies any further needs or questions at this time. VS stable. Patient ambulatory with steady gait. Assisted to ED entrance in wheelchair.   

## 2017-11-19 NOTE — Telephone Encounter (Signed)
Pt called this morning complaining of chest pain, radiating to the left down his left arm.  He states it has been going for several weeks but is getting worse.  He reports shortness of breath as well.  I advised him with his worsening symptoms he should go the ER to be evaluated.  He agreed and said he had a ride to the hospital.   Thanks,   Vernona Rieger-Laura

## 2017-11-19 NOTE — ED Notes (Signed)
Hooked patient up to the monitor patient is resting with call bell in reach and family at bedside 

## 2017-11-19 NOTE — Discharge Instructions (Signed)
Medications: Ibuprofen  Treatment: Take ibuprofen every 6 hours as prescribed while awake for the next week.  You can use ice 3-4 times daily on your chest as needed.  Try to have good posture when you are sitting a desk at work.  Follow-up: Please follow-up with your doctor for recheck and further evaluation.  Please return to emergency department if you develop any new or worsening symptoms.

## 2017-11-19 NOTE — ED Notes (Signed)
Following blood draw and IV stick, patient became diaphoretic, dizzy, and HR dropped into the 40s - sinus brady on the monitor and BP decreased to 79/47. Patient reports feeling better after vagal maneuvers , BP and HR both stable at this time. MD made aware, see new orders.

## 2017-12-21 ENCOUNTER — Other Ambulatory Visit: Payer: Self-pay | Admitting: Physician Assistant

## 2017-12-21 DIAGNOSIS — J4540 Moderate persistent asthma, uncomplicated: Secondary | ICD-10-CM

## 2017-12-21 MED ORDER — VENTOLIN HFA 108 (90 BASE) MCG/ACT IN AERS: 2.0000 | INHALATION_SPRAY | Freq: Four times a day (QID) | RESPIRATORY_TRACT | 3 refills | Status: DC | PRN

## 2017-12-21 NOTE — Telephone Encounter (Signed)
Refill sent.

## 2017-12-21 NOTE — Telephone Encounter (Signed)
Pt advised.   Thanks,   -Rika Daughdrill  

## 2017-12-21 NOTE — Telephone Encounter (Signed)
Patient states that he needs his rescue inhaler, he has run completely out, he called [facility] and they states that he need authorization from the office.  CB# [id number]

## 2018-01-22 ENCOUNTER — Other Ambulatory Visit: Payer: Self-pay | Admitting: Physician Assistant

## 2018-01-22 DIAGNOSIS — J45909 Unspecified asthma, uncomplicated: Secondary | ICD-10-CM

## 2018-02-26 ENCOUNTER — Telehealth: Payer: Self-pay | Admitting: Nurse Practitioner

## 2018-02-26 ENCOUNTER — Other Ambulatory Visit: Payer: Self-pay | Admitting: Nurse Practitioner

## 2018-02-26 NOTE — Telephone Encounter (Signed)
Pt requesting refill for [facility]IN) [facility]

## 2018-02-26 NOTE — Telephone Encounter (Signed)
LVM informing patient his clonazepam was refilled to [facility]. Left number for any questions.

## 2018-03-21 ENCOUNTER — Other Ambulatory Visit: Payer: Self-pay | Admitting: Physician Assistant

## 2018-03-21 DIAGNOSIS — J45909 Unspecified asthma, uncomplicated: Secondary | ICD-10-CM

## 2018-03-21 DIAGNOSIS — J4540 Moderate persistent asthma, uncomplicated: Secondary | ICD-10-CM

## 2018-03-22 MED ORDER — VENTOLIN HFA 108 (90 BASE) MCG/ACT IN AERS: 2.0000 | INHALATION_SPRAY | Freq: Four times a day (QID) | RESPIRATORY_TRACT | 3 refills | Status: DC | PRN

## 2018-06-18 ENCOUNTER — Ambulatory Visit (INDEPENDENT_AMBULATORY_CARE_PROVIDER_SITE_OTHER): Payer: 59 | Admitting: Family Medicine

## 2018-06-18 ENCOUNTER — Encounter: Payer: Self-pay | Admitting: Family Medicine

## 2018-06-18 VITALS — BP 118/80 | HR 65 | Temp 98.2°F | Ht 69.0 in | Wt 194.2 lb

## 2018-06-18 DIAGNOSIS — Z23 Encounter for immunization: Secondary | ICD-10-CM

## 2018-06-18 DIAGNOSIS — Z114 Encounter for screening for human immunodeficiency virus [HIV]: Secondary | ICD-10-CM | POA: Diagnosis not present

## 2018-06-18 DIAGNOSIS — Z0000 Encounter for general adult medical examination without abnormal findings: Secondary | ICD-10-CM

## 2018-06-18 DIAGNOSIS — E782 Mixed hyperlipidemia: Secondary | ICD-10-CM

## 2018-06-18 DIAGNOSIS — G40309 Generalized idiopathic epilepsy and epileptic syndromes, not intractable, without status epilepticus: Secondary | ICD-10-CM | POA: Diagnosis not present

## 2018-06-18 NOTE — Progress Notes (Signed)
Patient: Jay Francis, Male    DOB: 08/03/1986, 32 y.o.   MRN: [id number] Visit Date: 06/18/2018  Today's Provider: Dortha Kernennis Coumba Kellison, PA   Chief Complaint  Patient presents with  . Annual Exam   Subjective:    Annual physical exam Jay Francis is a 32 y.o. male who presents today for health maintenance and complete physical. He feels well. He reports exercising walking 2 miles 5 days per week. He reports he is sleeping fair. Patient states his sleeping pattern fluctuates quite a bit.  -----------------------------------------------------------------   Review of Systems  Constitutional: Negative.   HENT: Negative.   Eyes: Negative.   Respiratory: Positive for chest tightness and wheezing.   Cardiovascular: Negative.   Gastrointestinal: Negative.   Endocrine: Negative.   Genitourinary: Negative.   Musculoskeletal: Negative.   Skin: Negative.   Allergic/Immunologic: Positive for environmental allergies.  Neurological: Positive for seizures.  Hematological: Negative.   Psychiatric/Behavioral: Positive for sleep disturbance. The patient is nervous/anxious.    Social History      He  reports that he has never smoked. He has never used smokeless tobacco. He reports that he does not drink alcohol or use drugs.       Social History   Socioeconomic History  . Marital status: Single    Spouse name: Not on file  . Number of children: 0  . Years of education: Associates  . Highest education level: Not on file  Occupational History  . Occupation: Quarry managerLOGISTICS    Employer: [employer]  Social Needs  . Financial resource strain: Not on file  . Food insecurity:    Worry: Not on file    Inability: Not on file  . Transportation needs:    Medical: Not on file    Non-medical: Not on file  Tobacco Use  . Smoking status: Never Smoker  . Smokeless tobacco: Never Used  Substance and Sexual Activity  . Alcohol use: No  . Drug use: No  . Sexual activity: Yes    Partners:  Female  Lifestyle  . Physical activity:    Days per week: Not on file    Minutes per session: Not on file  . Stress: Not on file  Relationships  . Social connections:    Talks on phone: Not on file    Gets together: Not on file    Attends religious service: Not on file    Active member of club or organization: Not on file    Attends meetings of clubs or organizations: Not on file    Relationship status: Not on file  Other Topics Concern  . Not on file  Social History Narrative   Patient is single.    Patient has no children.    Patient is full-time.    Patient has an Associates degree.   Patient is right handed.    Past Medical History:  Diagnosis Date  . Panic disorder without agoraphobia   . Seizures [facility](HCC)    Patient Active Problem List   Diagnosis Date Noted  . 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 Seizures (HCC) 01/08/2017  . Panic disorder without agoraphobia 08/09/2013  . 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 None    . WISDOM TOOTH EXTRACTION     Family History        Family Status  Relation Name Status  . Mother  Alive  . Father  Alive  . MGF  (Not  Specified)  . Brother  Alive        His family history includes Allergies in his mother; Healthy in his brother, father, and mother; Melanoma in his father and mother; Seizures in his maternal grandfather.     Allergies  Allergen Reactions  . Amoxicillin Diarrhea and Nausea Only    Current Outpatient Medications:  .  BREO ELLIPTA 100-25 MCG/INH AEPB, INHALE 1 PUFF INTO THE LUNGS DAILY, Disp: 60 each, Rfl: 3 .  clonazePAM (KLONOPIN) 1 MG tablet, TAKE 1 TABLET BY MOUTH THREE TIMES DAILY, Disp: 90 tablet, Rfl: 5 .  ibuprofen (ADVIL,MOTRIN) 600 MG tablet, Take 1 tablet (600 mg total) by mouth every 6 (six) hours as needed., Disp: 30 tablet, Rfl: 0 .  loratadine (CLARITIN) 10 MG tablet, Take 10 mg by mouth daily., Disp: , Rfl:  .  Multiple  Vitamin (MULTIVITAMIN) capsule, Take 1 capsule by mouth daily., Disp: , Rfl:  .  Probiotic Product (PRO-BIOTIC BLEND PO), Take 1 tablet by mouth daily., Disp: , Rfl:  .  VENTOLIN HFA 108 (90 Base) MCG/ACT inhaler, Inhale 2 puffs into the lungs every 6 (six) hours as needed for wheezing or shortness of breath., Disp: 8 g, Rfl: 3   Patient Care Team: Maryella Shivers as PCP - General (Physician Assistant)     Objective:   Vitals: BP 118/80 (BP Location: Right Arm, Patient Position: Sitting, Cuff Size: Normal)   Pulse 65   Temp 98.2 F (36.8 C) (Oral)   Ht 5\' 9"  (1.753 m)   Wt 194 lb 3.2 oz (88.1 kg)   SpO2 99%   BMI 28.68 kg/m    Physical Exam  Constitutional: He is oriented to person, place, and time. He appears well-developed and well-nourished.  HENT:  Head: Normocephalic and atraumatic.  Right Ear: External ear normal.  Left Ear: External ear normal.  Nose: Nose normal.  Mouth/Throat: Oropharynx is clear and moist.  Eyes: Pupils are equal, round, and reactive to light. Conjunctivae and EOM are normal. Right eye exhibits no discharge.  Neck: Normal range of motion. Neck supple. No tracheal deviation present. No thyromegaly present.  Cardiovascular: Normal rate, regular rhythm, normal heart sounds and intact distal pulses.  No murmur heard. Pulmonary/Chest: Effort normal and breath sounds normal. No respiratory distress. He has no wheezes. He has no rales. He exhibits no tenderness.  Abdominal: Soft. He exhibits no distension and no mass. There is no tenderness. There is no rebound and no guarding.  Musculoskeletal: Normal range of motion. He exhibits no edema or tenderness.  Lymphadenopathy:    He has no cervical adenopathy.  Neurological: He is alert and oriented to person, place, and time. He has normal reflexes. He displays normal reflexes. No cranial nerve deficit. He exhibits normal muscle tone. Coordination normal.  Skin: Skin is warm and dry. No rash noted. No  erythema.  Psychiatric: He has a normal mood and affect. His behavior is normal. Judgment and thought content normal.    Depression Screen PHQ 2/9 Scores 06/18/2018 06/29/2017  PHQ - 2 Score 2 2  PHQ- 9 Score 4 4   Assessment & Plan:     Routine Health Maintenance and Physical Exam  Exercise Activities and Dietary recommendations Goals    Continue walking 2 miles for exercise qd 5 days a week and drink plenty of water.      Immunization History  Administered Date(s) Administered  . Tdap 06/18/2018    Health Maintenance  Topic Date Due  .  HIV Screening  03/16/2001  . INFLUENZA VACCINE  07/15/2018  . TETANUS/TDAP  06/18/2028   Discussed health benefits of physical activity, and encouraged him to engage in regular exercise appropriate for his age and condition.  -------------------------------------------------------------------  1. Annual physical exam Good general health. Will give tetanus booster today. Panic disorder well controlled by Clonazepam which has helped with sleep and seizure control. Given anticipatory guidance and will check routine labs. - Lipid Profile - TSH - CBC with Differential - Comprehensive metabolic panel  2. Encounter for screening for HIV - HIV antibody  3. Generalized convulsive epilepsy (HCC) Followed by Dr. Anne Hahn (neurologist) and controlled by treatment of panic disorder with Clonazepam. Recheck labs. - CBC with Differential - Comprehensive metabolic panel  4. Mixed hyperlipidemia LDL high with low HDL in 2018. Has changed diet and exercise since that time. Will check labs and continue present regimen. - Lipid Profile - TSH - Comprehensive metabolic panel  5. Need for tetanus booster - Tdap vaccine greater than or equal to 7yo IM   Dortha Kern, PA  [facility]

## 2018-06-19 LAB — LIPID PANEL
CHOLESTEROL TOTAL: 205 mg/dL — AB (ref 100–199)
Chol/HDL Ratio: 5.1 ratio — ABNORMAL HIGH (ref 0.0–5.0)
HDL: 40 mg/dL (ref >39)
LDL Calculated: 135 mg/dL — ABNORMAL HIGH (ref 0–99)
TRIGLYCERIDES: 148 mg/dL (ref 0–149)
VLDL CHOLESTEROL CAL: 30 mg/dL (ref 5–40)

## 2018-06-19 LAB — CBC WITH DIFFERENTIAL/PLATELET
BASOS ABS: 0 10*3/uL (ref 0.0–0.2)
BASOS: 1 %
EOS (ABSOLUTE): 0.1 10*3/uL (ref 0.0–0.4)
EOS: 1 %
HEMATOCRIT: 41.4 % (ref 37.5–51.0)
HEMOGLOBIN: 13.8 g/dL (ref 13.0–17.7)
IMMATURE GRANULOCYTES: 0 %
Immature Grans (Abs): 0 10*3/uL (ref 0.0–0.1)
LYMPHS ABS: 1.9 10*3/uL (ref 0.7–3.1)
Lymphs: 29 %
MCH: 28.8 pg (ref 26.6–33.0)
MCHC: 33.3 g/dL (ref 31.5–35.7)
MCV: 86 fL (ref 79–97)
Monocytes Absolute: 0.6 10*3/uL (ref 0.1–0.9)
Monocytes: 8 %
NEUTROS ABS: 4.1 10*3/uL (ref 1.4–7.0)
Neutrophils: 61 %
Platelets: 277 10*3/uL (ref 150–450)
RBC: 4.79 x10E6/uL (ref 4.14–5.80)
RDW: 11.8 % — ABNORMAL LOW (ref 12.3–15.4)
WBC: 6.7 10*3/uL (ref 3.4–10.8)

## 2018-06-19 LAB — COMPREHENSIVE METABOLIC PANEL
ALBUMIN: 4.9 g/dL (ref 3.5–5.5)
ALK PHOS: 49 IU/L (ref 39–117)
ALT: 32 IU/L (ref 0–44)
AST: 19 IU/L (ref 0–40)
Albumin/Globulin Ratio: 2 (ref 1.2–2.2)
BUN / CREAT RATIO: 10 (ref 9–20)
BUN: 11 mg/dL (ref 6–20)
Bilirubin Total: 0.2 mg/dL (ref 0.0–1.2)
CALCIUM: 9.7 mg/dL (ref 8.7–10.2)
CO2: 22 mmol/L (ref 20–29)
CREATININE: 1.09 mg/dL (ref 0.76–1.27)
Chloride: 105 mmol/L (ref 96–106)
GFR calc Af Amer: 103 mL/min/{1.73_m2} (ref >59)
GFR, EST NON AFRICAN AMERICAN: 89 mL/min/{1.73_m2} (ref >59)
GLOBULIN, TOTAL: 2.5 g/dL (ref 1.5–4.5)
GLUCOSE: 88 mg/dL (ref 65–99)
Potassium: 4.4 mmol/L (ref 3.5–5.2)
SODIUM: 143 mmol/L (ref 134–144)
Total Protein: 7.4 g/dL (ref 6.0–8.5)

## 2018-06-19 LAB — HIV ANTIBODY (ROUTINE TESTING W REFLEX): HIV Screen 4th Generation wRfx: NONREACTIVE

## 2018-06-19 LAB — TSH: TSH: 2.8 u[IU]/mL (ref 0.450–4.500)

## 2018-06-24 ENCOUNTER — Encounter: Payer: Self-pay | Admitting: Family Medicine

## 2018-07-08 NOTE — Progress Notes (Signed)
[facility]  PATIENT: Jay Francis DOB: 11/16/1986   REASON FOR VISIT: Follow-up for generalized epilepsy, panic disorder HISTORY FROM: Patient    HISTORY OF PRESENT ILLNESS:Jay Francis, 32 year old male returns for followup. He has a history of isolated seizure in September 2013 and another seizure event Sunday the 16th of July 2017 after missing 2 doses of his clonazepam . He also has panic disorder which is currently well controlled on clonazepam. He denies any side effects to the medication, no drowsiness or gait instability. He works full time.  He recently got married in May.  He returns for reevaluation.  He also carries a diagnosis of asthma.  HISTORY:of a panic disorder. The patient has been placed on clonazepam, and he is tolerating the medication quite well. The patient is on 1 mg, taking 3 times daily .The patient indicates that overall, he feels as if the medication has changed his life, and he is able to function much better. The patient is tolerating medication well without drowsiness or gait instability. Patient has a history of isolated witnessed generalized seizure in September 2013. CT was unremarkable, normal EEG. No further seizure activity since that time   REVIEW OF SYSTEMS: Full 14 system review of systems performed and notable only for those listed, all others are neg:  Constitutional: neg  Cardiovascular: neg Ear/Nose/Throat: neg  Skin: neg Eyes: neg Respiratory: neg Gastroitestinal: neg  Hematology/Lymphatic: neg  Endocrine: neg Musculoskeletal:neg Allergy/Immunology: neg Neurological: Seizure  Psychiatric: Panic disorder Sleep : neg   ALLERGIES: Allergies  Allergen Reactions  . Amoxicillin Diarrhea and Nausea Only    HOME MEDICATIONS: Outpatient Medications Prior to Visit  Medication Sig Dispense Refill  . BREO ELLIPTA 100-25 MCG/INH AEPB INHALE 1 PUFF INTO THE LUNGS DAILY 60 each 3  . clonazePAM (KLONOPIN) 1 MG tablet TAKE 1  TABLET BY MOUTH THREE TIMES DAILY 90 tablet 5  . ibuprofen (ADVIL,MOTRIN) 600 MG tablet Take 1 tablet (600 mg total) by mouth every 6 (six) hours as needed. 30 tablet 0  . loratadine (CLARITIN) 10 MG tablet Take 10 mg by mouth daily.    . Probiotic Product (PRO-BIOTIC BLEND PO) Take 1 tablet by mouth daily.    . VENTOLIN HFA 108 (90 Base) MCG/ACT inhaler Inhale 2 puffs into the lungs every 6 (six) hours as needed for wheezing or shortness of breath. 8 g 3  . Multiple Vitamin (MULTIVITAMIN) capsule Take 1 capsule by mouth daily.     No facility-administered medications prior to visit.     PAST MEDICAL HISTORY: Past Medical History:  Diagnosis Date  . Panic disorder without agoraphobia   . Seizures (HCC)     PAST SURGICAL HISTORY: Past Surgical History:  Procedure Laterality Date  . None    . WISDOM TOOTH EXTRACTION      FAMILY HISTORY: Family History  Problem Relation Age of Onset  . Healthy Mother   . Allergies Mother   . Melanoma Mother   . Healthy Father   . Melanoma Father   . Seizures Maternal Grandfather   . Healthy Brother     SOCIAL HISTORY: Social History   Socioeconomic History  . Marital status: Single    Spouse name: Not on file  . Number of children: 0  . Years of education: Associates  . Highest education level: Not on file  Occupational History  . Occupation: Quarry manager: [employer]  Social Needs  . Financial resource strain: Not on file  .  Food insecurity:    Worry: Not on file    Inability: Not on file  . Transportation needs:    Medical: Not on file    Non-medical: Not on file  Tobacco Use  . Smoking status: Never Smoker  . Smokeless tobacco: Never Used  Substance and Sexual Activity  . Alcohol use: No  . Drug use: No  . Sexual activity: Yes    Partners: Female  Lifestyle  . Physical activity:    Days per week: Not on file    Minutes per session: Not on file  . Stress: Not on file  Relationships  . Social  connections:    Talks on phone: Not on file    Gets together: Not on file    Attends religious service: Not on file    Active member of club or organization: Not on file    Attends meetings of clubs or organizations: Not on file    Relationship status: Not on file  . Intimate partner violence:    Fear of current or ex partner: Not on file    Emotionally abused: Not on file    Physically abused: Not on file    Forced sexual activity: Not on file  Other Topics Concern  . Not on file  Social History Narrative   Patient is single.    Patient has no children.    Patient is full-time.    Patient has an Associates degree.   Patient is right handed.      PHYSICAL EXAM  Vitals:   07/12/18 1529  BP: 124/86  Pulse: 64  Weight: 194 lb 9.6 oz (88.3 kg)  Height: 5\' 9"  (1.753 m)   Body mass index is 28.74 kg/m. Generalized: Well developed, in no acute distress  Head: normocephalic and atraumatic,. Oropharynx benign  Neck: Supple,  Neurological examination   Mentation: Alert oriented to time, place, history taking. Attention span and concentration appropriate. Recent and remote memory intact. Follows all commands speech and language fluent.   Cranial nerve II-XII: Pupils were equal round reactive to light extraocular movements were full, visual field were full on confrontational test. Facial sensation and strength were normal. hearing was intact to finger rubbing bilaterally. Uvula tongue midline. head turning and shoulder shrug were normal and symmetric.Tongue protrusion into cheek strength was normal. Motor: normal bulk and tone, full strength in the BUE, BLE, fine finger movements normal, no pronator drift. No focal weakness Sensory: normal and symmetric to light touch,  in the upper and lower extremities Coordination: finger-nose-finger, heel-to-shin bilaterally, no dysmetria Reflexes: Brachioradialis 2/2, biceps 2/2, triceps 2/2, patellar 2/2, Achilles 2/2, plantar responses  were flexor bilaterally. Gait and Station: Rising up from seated position without assistance, normal stance, moderate stride, good arm swing, smooth turning, able to perform tiptoe, and heel walking without difficulty. Tandem gait is steady   DIAGNOSTIC DATA (LABS, IMAGING, TESTING) -  ASSESSMENT AND PLAN 32 y.o. year old male has a past medical history of isolated seizure event and another seizure on 06/29/16 after missing 2 doses of his clonazepam, did not eat and  had not slept well the night before.  Panic disorder without agoraphobia. here to follow-up. His panic disorder is under control with Klonopin. Recent diagnosis of asthma   PLAN:Continue clonazepam at current dose will refill Call for any further seizure activity Follow-up yearly Jay Francis, [facility]NP, [facility], APRN  [facility] [address], [address] [place], [place] [address] [phone]

## 2018-07-12 ENCOUNTER — Ambulatory Visit: Payer: 59 | Admitting: Nurse Practitioner

## 2018-07-12 ENCOUNTER — Encounter: Payer: Self-pay | Admitting: Nurse Practitioner

## 2018-07-12 VITALS — BP 124/86 | HR 64 | Ht 69.0 in | Wt 194.6 lb

## 2018-07-12 DIAGNOSIS — G40309 Generalized idiopathic epilepsy and epileptic syndromes, not intractable, without status epilepticus: Secondary | ICD-10-CM | POA: Diagnosis not present

## 2018-07-12 DIAGNOSIS — F410 Panic disorder [episodic paroxysmal anxiety] without agoraphobia: Secondary | ICD-10-CM

## 2018-07-12 MED ORDER — CLONAZEPAM 1 MG PO TABS: 1.0000 mg | ORAL_TABLET | Freq: Three times a day (TID) | ORAL | 5 refills | Status: DC

## 2018-07-12 NOTE — Patient Instructions (Signed)
Continue clonazepam at current dose will refill Call for any further seizure activity Follow-up yearly

## 2018-07-12 NOTE — Progress Notes (Signed)
I have read the note, and I agree with the clinical assessment and plan.  Omare Bilotta K Ann Groeneveld   

## 2018-07-23 ENCOUNTER — Other Ambulatory Visit: Payer: Self-pay | Admitting: Physician Assistant

## 2018-07-23 DIAGNOSIS — J45909 Unspecified asthma, uncomplicated: Secondary | ICD-10-CM

## 2018-09-14 ENCOUNTER — Other Ambulatory Visit: Payer: Self-pay | Admitting: Physician Assistant

## 2018-09-14 DIAGNOSIS — J4540 Moderate persistent asthma, uncomplicated: Secondary | ICD-10-CM

## 2018-10-20 ENCOUNTER — Other Ambulatory Visit: Payer: Self-pay | Admitting: Physician Assistant

## 2018-10-20 DIAGNOSIS — J45909 Unspecified asthma, uncomplicated: Secondary | ICD-10-CM

## 2018-11-16 ENCOUNTER — Other Ambulatory Visit: Payer: Self-pay | Admitting: Physician Assistant

## 2018-11-16 DIAGNOSIS — J4540 Moderate persistent asthma, uncomplicated: Secondary | ICD-10-CM

## 2018-11-17 NOTE — Telephone Encounter (Signed)
Pt needing a refill on: PROAIR HFA 108 (90 Base) MCG/ACT inhaler - pt is almost out  Please fill at:  [id number] [place]- [place], [place] - [address] [facility] [address] [phone] (Phone) [phone] (Fax)   Thanks, [facility]

## 2018-12-08 ENCOUNTER — Other Ambulatory Visit: Payer: Self-pay | Admitting: Physician Assistant

## 2018-12-08 DIAGNOSIS — J4540 Moderate persistent asthma, uncomplicated: Secondary | ICD-10-CM

## 2018-12-13 ENCOUNTER — Other Ambulatory Visit: Payer: Self-pay | Admitting: Physician Assistant

## 2018-12-13 DIAGNOSIS — J45909 Unspecified asthma, uncomplicated: Secondary | ICD-10-CM

## 2018-12-27 ENCOUNTER — Ambulatory Visit: Payer: 59 | Admitting: Family Medicine

## 2018-12-27 ENCOUNTER — Encounter: Payer: Self-pay | Admitting: Family Medicine

## 2018-12-27 VITALS — BP 110/70 | HR 70 | Temp 97.8°F | Resp 16 | Wt 187.2 lb

## 2018-12-27 DIAGNOSIS — E7800 Pure hypercholesterolemia, unspecified: Secondary | ICD-10-CM | POA: Diagnosis not present

## 2018-12-27 DIAGNOSIS — G40309 Generalized idiopathic epilepsy and epileptic syndromes, not intractable, without status epilepticus: Secondary | ICD-10-CM

## 2018-12-27 DIAGNOSIS — F410 Panic disorder [episodic paroxysmal anxiety] without agoraphobia: Secondary | ICD-10-CM | POA: Diagnosis not present

## 2018-12-27 NOTE — Progress Notes (Signed)
Patient: Jay Francis Male    DOB: 1986/01/05   33 y.o.   MRN: [id number] Visit Date: 12/27/2018  Today's Provider: Dortha Kern, PA   Chief Complaint  Patient presents with  . Follow-up    Cholesterol   Subjective:     HPI   Lipid/Cholesterol, Follow-up:   Last seen for this6 months ago.  Management changes since that visit include recommended Krill Oil 1000 mg QD and low fat diet with exercise. . Last Lipid Panel:    Component Value Date/Time   CHOL 205 (H) 06/18/2018 1445   TRIG 148 06/18/2018 1445   HDL 40 06/18/2018 1445   CHOLHDL 5.1 (H) 06/18/2018 1445   LDLCALC 135 (H) 06/18/2018 1445    He reports excellent compliance with treatment. Current symptoms include none and have been stable. Weight trend: decreasing steadily Current diet: in general, a "healthy" diet   Current exercise: walking  Wt Readings from Last 3 Encounters:  12/27/18 187 lb 3.2 oz (84.9 kg)  07/12/18 194 lb 9.6 oz (88.3 kg)  06/18/18 194 lb 3.2 oz (88.1 kg)    ------------------------------------------------------------------- Past Medical History:  Diagnosis Date  . Panic disorder without agoraphobia   . Seizures (HCC)    Past Surgical History:  Procedure Laterality Date  . None    . WISDOM TOOTH EXTRACTION     Family History  Problem Relation Age of Onset  . Healthy Mother   . Allergies Mother   . Melanoma Mother   . Healthy Father   . Melanoma Father   . Seizures Maternal Grandfather   . Healthy Brother    Allergies  Allergen Reactions  . Amoxicillin Diarrhea and Nausea Only    Current Outpatient Medications:  .  BREO ELLIPTA 100-25 MCG/INH AEPB, INHALE 1 PUFF INTO THE LUNGS DAILY, Disp: 60 each, Rfl: 1 .  clonazePAM (KLONOPIN) 1 MG tablet, Take 1 tablet (1 mg total) by mouth 3 (three) times daily., Disp: 90 tablet, Rfl: 5 .  ibuprofen (ADVIL,MOTRIN) 600 MG tablet, Take 1 tablet (600 mg total) by mouth every 6 (six) hours as needed., Disp: 30 tablet, Rfl:  0 .  loratadine (CLARITIN) 10 MG tablet, Take 10 mg by mouth daily., Disp: , Rfl:  .  PROAIR HFA 108 (90 Base) MCG/ACT inhaler, INHALE 2 PUFFS INTO THE LUNGS EVERY 6 HOURS AS NEEDED FOR WHEEZING OR SHORTNESS OF BREATH, Disp: 8.5 g, Rfl: 1 .  Probiotic Product (PRO-BIOTIC BLEND PO), Take 1 tablet by mouth daily., Disp: , Rfl:   Review of Systems  Constitutional: Negative.   HENT: Negative.   Eyes: Negative.   Respiratory: Negative.   Cardiovascular: Negative.   Gastrointestinal: Negative.   Genitourinary: Negative.   Musculoskeletal: Negative.   Psychiatric/Behavioral: Negative.     Social History   Tobacco Use  . Smoking status: Never Smoker  . Smokeless tobacco: Never Used  Substance Use Topics  . Alcohol use: No     Objective:   BP 110/70 (BP Location: Left Arm, Patient Position: Sitting, Cuff Size: Large)   Pulse 70   Temp 97.8 F (36.6 C) (Oral)   Resp 16   Wt 187 lb 3.2 oz (84.9 kg)   SpO2 97%   BMI 27.64 kg/m    Vitals:   12/27/18 1502  BP: 110/70  Pulse: 70  Resp: 16  Temp: 97.8 F (36.6 C)  TempSrc: Oral  SpO2: 97%  Weight: 187 lb 3.2 oz (84.9 kg)   Physical Exam  Constitutional:      General: He is not in acute distress.    Appearance: He is well-developed.  HENT:     Head: Normocephalic and atraumatic.     Right Ear: Hearing normal.     Left Ear: Hearing normal.     Nose: Nose normal.  Eyes:     General: Lids are normal. No scleral icterus.       Right eye: No discharge.        Left eye: No discharge.     Conjunctiva/sclera: Conjunctivae normal.  Neck:     Musculoskeletal: Neck supple.  Cardiovascular:     Rate and Rhythm: Normal rate and regular rhythm.     Heart sounds: Normal heart sounds.  Pulmonary:     Effort: Pulmonary effort is normal. No respiratory distress.  Abdominal:     General: Bowel sounds are normal.     Palpations: Abdomen is soft.  Musculoskeletal: Normal range of motion.  Skin:    Findings: No lesion or rash.   Neurological:     Mental Status: He is alert and oriented to person, place, and time.  Psychiatric:        Speech: Speech normal.        Behavior: Behavior normal.        Thought Content: Thought content normal.       Assessment & Plan    1. Hypercholesterolemia Has lost 7 lbs in the past 6 months with regular exercise and lower fat diet. No chest pains, palpitations or dyspnea. Recheck Lipid Panel and CMP. Continue Krill Oil and exercise. Follow up pending reports. - Lipid panel - Comprehensive metabolic panel  2. Panic disorder without agoraphobia Well controlled with Clonazepam 1 mg TID per Dr. Anne HahnWillis (neurologist). No recent attacks, eating well, exercising regularly and sleeping well. Continue present medications and recheck follow up labs. - CBC with Differential/Platelet - Comprehensive metabolic panel  3. Generalized convulsive epilepsy (HCC) No seizure activity in the past 2 years. Continues Clonazepam with good control. Recheck routine labs and continue annual follow up with Dr. Anne HahnWillis (neurologist). - CBC with Differential/Platelet - Comprehensive metabolic panel     Dortha Kernennis Brittanya Winburn, PA  [facility]

## 2018-12-28 ENCOUNTER — Telehealth: Payer: Self-pay

## 2018-12-28 LAB — LIPID PANEL
Chol/HDL Ratio: 5.2 ratio — ABNORMAL HIGH (ref 0.0–5.0)
Cholesterol, Total: 202 mg/dL — ABNORMAL HIGH (ref 100–199)
HDL: 39 mg/dL — AB (ref >39)
LDL Calculated: 143 mg/dL — ABNORMAL HIGH (ref 0–99)
Triglycerides: 98 mg/dL (ref 0–149)
VLDL Cholesterol Cal: 20 mg/dL (ref 5–40)

## 2018-12-28 LAB — COMPREHENSIVE METABOLIC PANEL
ALBUMIN: 5.2 g/dL (ref 3.5–5.5)
ALK PHOS: 50 IU/L (ref 39–117)
ALT: 29 IU/L (ref 0–44)
AST: 19 IU/L (ref 0–40)
Albumin/Globulin Ratio: 2.5 — ABNORMAL HIGH (ref 1.2–2.2)
BUN / CREAT RATIO: 11 (ref 9–20)
BUN: 11 mg/dL (ref 6–20)
Bilirubin Total: 0.5 mg/dL (ref 0.0–1.2)
CALCIUM: 10.1 mg/dL (ref 8.7–10.2)
CO2: 24 mmol/L (ref 20–29)
CREATININE: 1.02 mg/dL (ref 0.76–1.27)
Chloride: 103 mmol/L (ref 96–106)
GFR calc Af Amer: 112 mL/min/{1.73_m2} (ref >59)
GFR calc non Af Amer: 97 mL/min/{1.73_m2} (ref >59)
Globulin, Total: 2.1 g/dL (ref 1.5–4.5)
Glucose: 89 mg/dL (ref 65–99)
Potassium: 4.8 mmol/L (ref 3.5–5.2)
Sodium: 142 mmol/L (ref 134–144)
TOTAL PROTEIN: 7.3 g/dL (ref 6.0–8.5)

## 2018-12-28 LAB — CBC WITH DIFFERENTIAL/PLATELET
Basophils Absolute: 0 10*3/uL (ref 0.0–0.2)
Basos: 1 %
EOS (ABSOLUTE): 0 10*3/uL (ref 0.0–0.4)
Eos: 0 %
HEMOGLOBIN: 14.4 g/dL (ref 13.0–17.7)
Hematocrit: 41.8 % (ref 37.5–51.0)
IMMATURE GRANS (ABS): 0 10*3/uL (ref 0.0–0.1)
IMMATURE GRANULOCYTES: 0 %
LYMPHS: 31 %
Lymphocytes Absolute: 1.9 10*3/uL (ref 0.7–3.1)
MCH: 29.2 pg (ref 26.6–33.0)
MCHC: 34.4 g/dL (ref 31.5–35.7)
MCV: 85 fL (ref 79–97)
MONOCYTES: 7 %
Monocytes Absolute: 0.4 10*3/uL (ref 0.1–0.9)
Neutrophils Absolute: 3.7 10*3/uL (ref 1.4–7.0)
Neutrophils: 61 %
Platelets: 271 10*3/uL (ref 150–450)
RBC: 4.93 x10E6/uL (ref 4.14–5.80)
RDW: 12.1 % (ref 11.6–15.4)
WBC: 6.1 10*3/uL (ref 3.4–10.8)

## 2018-12-28 NOTE — Telephone Encounter (Signed)
Patient advised as directed below and scheduled for 3 month appointment.

## 2019-01-18 ENCOUNTER — Other Ambulatory Visit: Payer: Self-pay | Admitting: Nurse Practitioner

## 2019-01-18 NOTE — Telephone Encounter (Signed)
Pt request refill for clonazePAM (KLONOPIN) 1 MG tablet sent to [facility]

## 2019-01-19 MED ORDER — CLONAZEPAM 1 MG PO TABS: 1.0000 mg | ORAL_TABLET | Freq: Three times a day (TID) | ORAL | 5 refills | Status: DC

## 2019-03-14 ENCOUNTER — Encounter: Payer: Self-pay | Admitting: Family Medicine

## 2019-03-14 ENCOUNTER — Other Ambulatory Visit: Payer: Self-pay | Admitting: *Deleted

## 2019-03-14 DIAGNOSIS — J45909 Unspecified asthma, uncomplicated: Secondary | ICD-10-CM

## 2019-03-14 MED ORDER — FLUTICASONE FUROATE-VILANTEROL 100-25 MCG/INH IN AEPB: INHALATION_SPRAY | RESPIRATORY_TRACT | 1 refills | Status: DC

## 2019-03-15 ENCOUNTER — Encounter: Payer: Self-pay | Admitting: Family Medicine

## 2019-03-29 ENCOUNTER — Ambulatory Visit: Payer: Self-pay | Admitting: Family Medicine

## 2019-05-03 ENCOUNTER — Ambulatory Visit: Payer: 59 | Admitting: Family Medicine

## 2019-05-03 ENCOUNTER — Encounter: Payer: Self-pay | Admitting: Family Medicine

## 2019-05-03 ENCOUNTER — Other Ambulatory Visit: Payer: Self-pay

## 2019-05-03 VITALS — BP 120/70 | HR 60 | Temp 98.4°F | Wt 183.0 lb

## 2019-05-03 DIAGNOSIS — R569 Unspecified convulsions: Secondary | ICD-10-CM | POA: Diagnosis not present

## 2019-05-03 DIAGNOSIS — J45909 Unspecified asthma, uncomplicated: Secondary | ICD-10-CM | POA: Insufficient documentation

## 2019-05-03 DIAGNOSIS — J4520 Mild intermittent asthma, uncomplicated: Secondary | ICD-10-CM | POA: Diagnosis not present

## 2019-05-03 DIAGNOSIS — E785 Hyperlipidemia, unspecified: Secondary | ICD-10-CM | POA: Insufficient documentation

## 2019-05-03 DIAGNOSIS — J309 Allergic rhinitis, unspecified: Secondary | ICD-10-CM | POA: Insufficient documentation

## 2019-05-03 DIAGNOSIS — E782 Mixed hyperlipidemia: Secondary | ICD-10-CM | POA: Insufficient documentation

## 2019-05-03 DIAGNOSIS — J301 Allergic rhinitis due to pollen: Secondary | ICD-10-CM

## 2019-05-03 DIAGNOSIS — F410 Panic disorder [episodic paroxysmal anxiety] without agoraphobia: Secondary | ICD-10-CM

## 2019-05-03 NOTE — Progress Notes (Signed)
Jay Francis.   Jay Francis  MRN: [id number] DOB: 06/30/1986  Subjective:  HPI   The patient is a 33 year old male who presents for follow up of his cholesterol.  He was last seen on 12/27/18.  He is currently not any medications.  He states he has been working from home and has been able to exercise more often.  He states he has lost weight and feels he is doing well.  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   Past Medical History:  Diagnosis Date   Panic disorder without agoraphobia    Seizures (HCC)    Past Surgical History:  Procedure Laterality Date   None     WISDOM TOOTH EXTRACTION     Family History  Problem Relation Age of Onset   Healthy Mother    Allergies Mother    Melanoma Mother    Healthy Father    Melanoma Father    Seizures Maternal Grandfather    Healthy Brother    Social History   Socioeconomic History   Marital status: Single    Spouse name: Not on file   Number of children: 0   Years of education: Associates   Highest education level: Not on file  Occupational History   Occupation: Quarry managerLOGISTICS    Employer: [id number]  Social Needs   Physicist, medicalinancial resource strain: Not on file   Food insecurity:    Worry: Not on file    Inability: Not on file   Transportation needs:    Medical: Not on file    Non-medical: Not on file  Tobacco Use   Smoking status: Never Smoker   Smokeless tobacco: Never Used  Substance and Sexual Activity   Alcohol use: No   Drug use: No   Sexual activity: Yes    Partners: Female  Lifestyle   Physical activity:    Days per week: Not on file   Minutes per session: Not on file   Stress: Not on file  Relationships   Social connections:    Talks on phone: Not on file    Gets together: Not on file    Attends religious service: Not on file    Active member of club or organization: Not on file    Attends meetings of clubs or organizations: Not on file    Relationship status: Not on file   Intimate partner violence:    Fear of current or ex partner: Not on file    Emotionally abused: Not on file    Physically abused: Not on file    Forced sexual activity: Not on file  Other Topics Concern   Not on file  Social History Narrative   Patient is single.    Patient has no children.    Patient is full-time.    Patient has an Associates degree.   Patient is right handed.     Outpatient Encounter Medications as of 05/03/2019  Medication Sig   clonazePAM (KLONOPIN) 1 MG tablet Take 1 tablet (1 mg total) by mouth 3 (three) times daily.   fluticasone furoate-vilanterol (BREO ELLIPTA) 100-25 MCG/INH AEPB  INHALE 1 PUFF INTO THE LUNGS DAILY   ibuprofen (ADVIL,MOTRIN) 600 MG tablet Take 1 tablet (600 mg total) by mouth every 6 (six) hours as needed.   loratadine (CLARITIN) 10 MG tablet Take 10 mg by mouth daily.   PROAIR HFA 108 (90 Base) MCG/ACT inhaler INHALE 2 PUFFS INTO THE LUNGS EVERY 6 HOURS AS NEEDED FOR WHEEZING OR SHORTNESS OF BREATH   Probiotic Product (PRO-BIOTIC BLEND PO) Take 1 tablet by mouth daily.   No facility-administered encounter medications on file as of 05/03/2019.     Allergies  Allergen Reactions   Amoxicillin Diarrhea and Nausea Only    Review of Systems  Constitutional: Negative for fever and malaise/fatigue.  Respiratory: Positive for wheezing (chronic, no change, secondary to asthma and allergies). Negative for cough.   Cardiovascular: Negative for chest pain, palpitations and leg swelling.    Objective:  BP 120/70 (BP Location: Right Arm, Patient Position: Sitting, Cuff Size: Normal)    Pulse  60    Temp 98.4 F (36.9 C) (Oral)    Wt 183 lb (83 kg)    SpO2 97%    BMI 27.02 kg/m  Wt Readings from Last 3 Encounters:  05/03/19 183 lb (83 kg)  12/27/18 187 lb 3.2 oz (84.9 kg)  07/12/18 194 lb 9.6 oz (88.3 kg)   Physical Exam  Constitutional: He is oriented to person, place, and time and well-developed, well-nourished, and in no distress.  HENT:  Head: Normocephalic.  Eyes: Conjunctivae are normal.  Neck: Neck supple.  Pulmonary/Chest: Effort normal.  Abdominal: Soft.  Musculoskeletal: Normal range of motion.  Neurological: He is alert and oriented to person, place, and time.  Skin: No rash noted.  Psychiatric: Mood, affect and judgment normal.    Assessment and Plan :   1. Mixed hyperlipidemia Tolerating Red Yeast Rice with Co-Q 10 daily. Working at home and exercising BID by walking. Labs on 12-27-18 showed total cholesterol 202, HDL 39 and LDL 143 with ratio of 5.2. Continue to follow low fat diet, exercise program and above supplements. Recheck Lipid Panel and CMP. Follow up pending reports. - Lipid panel - Comprehensive metabolic panel  2. Seizures (HCC) No seizure in a couple years. States neurologist felt the use of Clonazepam for panic attacks, also controls seizures well. Recheck routine labs. - CBC with Differential/Platelet - Comprehensive metabolic panel  3. Panic disorder without agoraphobia No panic attacks. Well controlled with use of Clonazepam 1 mg TID. Recheck CBC and CMP. No daytime sleepiness. - CBC with Differential/Platelet - Comprehensive metabolic panel  4. Mild intermittent asthma without complication Tolerating Breo 100-25 mcg/inh one puff qd and ProAir-HFA 2 puffs QID prn wheeze. No cough or congestion today. Lungs clear. Able to walk for exercise twice a day (up to [phone] steps a day).   5. Seasonal allergic rhinitis due to pollen Flares during spring season. Well controlled with use of Claritin prn.  - CBC with Differential/Platelet

## 2019-05-04 LAB — COMPREHENSIVE METABOLIC PANEL
ALT: 25 IU/L (ref 0–44)
AST: 17 IU/L (ref 0–40)
Albumin/Globulin Ratio: 2.6 — ABNORMAL HIGH (ref 1.2–2.2)
Albumin: 5 g/dL (ref 4.0–5.0)
Alkaline Phosphatase: 45 IU/L (ref 39–117)
BUN/Creatinine Ratio: 19 (ref 9–20)
BUN: 18 mg/dL (ref 6–20)
Bilirubin Total: 0.3 mg/dL (ref 0.0–1.2)
CO2: 22 mmol/L (ref 20–29)
Calcium: 9.7 mg/dL (ref 8.7–10.2)
Chloride: 106 mmol/L (ref 96–106)
Creatinine, Ser: 0.96 mg/dL (ref 0.76–1.27)
GFR calc Af Amer: 120 mL/min/{1.73_m2} (ref >59)
GFR calc non Af Amer: 103 mL/min/{1.73_m2} (ref >59)
Globulin, Total: 1.9 g/dL (ref 1.5–4.5)
Glucose: 99 mg/dL (ref 65–99)
Potassium: 4.7 mmol/L (ref 3.5–5.2)
Sodium: 140 mmol/L (ref 134–144)
Total Protein: 6.9 g/dL (ref 6.0–8.5)

## 2019-05-04 LAB — CBC WITH DIFFERENTIAL/PLATELET
Basophils Absolute: 0 10*3/uL (ref 0.0–0.2)
Basos: 1 %
EOS (ABSOLUTE): 0 10*3/uL (ref 0.0–0.4)
Eos: 1 %
Hematocrit: 40 % (ref 37.5–51.0)
Hemoglobin: 13.4 g/dL (ref 13.0–17.7)
Immature Grans (Abs): 0 10*3/uL (ref 0.0–0.1)
Immature Granulocytes: 1 %
Lymphocytes Absolute: 1.9 10*3/uL (ref 0.7–3.1)
Lymphs: 30 %
MCH: 28.8 pg (ref 26.6–33.0)
MCHC: 33.5 g/dL (ref 31.5–35.7)
MCV: 86 fL (ref 79–97)
Monocytes Absolute: 0.6 10*3/uL (ref 0.1–0.9)
Monocytes: 10 %
Neutrophils Absolute: 3.6 10*3/uL (ref 1.4–7.0)
Neutrophils: 57 %
Platelets: 235 10*3/uL (ref 150–450)
RBC: 4.66 x10E6/uL (ref 4.14–5.80)
RDW: 12.3 % (ref 11.6–15.4)
WBC: 6.2 10*3/uL (ref 3.4–10.8)

## 2019-05-04 LAB — LIPID PANEL
Chol/HDL Ratio: 3.7 ratio (ref 0.0–5.0)
Cholesterol, Total: 191 mg/dL (ref 100–199)
HDL: 52 mg/dL (ref >39)
LDL Calculated: 126 mg/dL — ABNORMAL HIGH (ref 0–99)
Triglycerides: 67 mg/dL (ref 0–149)
VLDL Cholesterol Cal: 13 mg/dL (ref 5–40)

## 2019-05-05 ENCOUNTER — Telehealth: Payer: Self-pay

## 2019-05-05 NOTE — Telephone Encounter (Signed)
-----   Message from Tamsen Roers, [place] sent at 05/05/2019 10:42 AM EDT ----- All blood tests normal except LDL cholesterol still a little elevated (but better than last checks). Recheck in 4 months. Recommend a lower fat diet and continue regular exercise.

## 2019-05-05 NOTE — Telephone Encounter (Signed)
Patient notified of lab results and instructions

## 2019-05-13 ENCOUNTER — Other Ambulatory Visit: Payer: Self-pay | Admitting: Family Medicine

## 2019-05-13 DIAGNOSIS — J45909 Unspecified asthma, uncomplicated: Secondary | ICD-10-CM

## 2019-06-09 ENCOUNTER — Other Ambulatory Visit: Payer: Self-pay

## 2019-06-09 ENCOUNTER — Encounter: Payer: Self-pay | Admitting: Physician Assistant

## 2019-06-09 ENCOUNTER — Ambulatory Visit: Payer: 59 | Admitting: Physician Assistant

## 2019-06-09 ENCOUNTER — Encounter: Payer: Self-pay | Admitting: Family Medicine

## 2019-06-09 VITALS — BP 122/80 | HR 94 | Temp 98.0°F | Wt 173.6 lb

## 2019-06-09 DIAGNOSIS — L237 Allergic contact dermatitis due to plants, except food: Secondary | ICD-10-CM | POA: Diagnosis not present

## 2019-06-09 MED ORDER — PREDNISONE 10 MG (21) PO TBPK: ORAL_TABLET | ORAL | 0 refills | Status: DC

## 2019-06-09 NOTE — Progress Notes (Signed)
Patient: Jay Francis Male    DOB: 25-Mar-1986   33 y.o.   MRN: [id number] Visit Date: 06/09/2019  Today's Provider: Trinna Post, PA-C   Chief Complaint  Patient presents with  . Rash   Subjective:   Patient C/O poison ivy on bilateral forearms and left flanlk area. He states he was cutting a tree down on Saturday and came in contact with a poison ivy plant. He has tried anti-itch cream with no relief.     Allergies  Allergen Reactions  . Amoxicillin Diarrhea and Nausea Only     Current Outpatient Medications:  .  BREO ELLIPTA 100-25 MCG/INH AEPB, INHALE 1 PUFF BY MOUTH INTO THE LUNGS DAILY, Disp: 60 each, Rfl: 3 .  clonazePAM (KLONOPIN) 1 MG tablet, Take 1 tablet (1 mg total) by mouth 3 (three) times daily., Disp: 90 tablet, Rfl: 5 .  ibuprofen (ADVIL,MOTRIN) 600 MG tablet, Take 1 tablet (600 mg total) by mouth every 6 (six) hours as needed., Disp: 30 tablet, Rfl: 0 .  loratadine (CLARITIN) 10 MG tablet, Take 10 mg by mouth daily., Disp: , Rfl:  .  PROAIR HFA 108 (90 Base) MCG/ACT inhaler, INHALE 2 PUFFS INTO THE LUNGS EVERY 6 HOURS AS NEEDED FOR WHEEZING OR SHORTNESS OF BREATH, Disp: 8.5 g, Rfl: 1 .  Probiotic Product (PRO-BIOTIC BLEND PO), Take 1 tablet by mouth daily., Disp: , Rfl:   Review of Systems  Constitutional: Negative.   Respiratory: Negative.   Musculoskeletal: Negative.   Skin: Positive for rash.    Social History   Tobacco Use  . Smoking status: Never Smoker  . Smokeless tobacco: Never Used  Substance Use Topics  . Alcohol use: No      Objective:   BP 122/80 (BP Location: Left Arm, Patient Position: Sitting, Cuff Size: Normal)   Pulse 94   Temp 98 F (36.7 C) (Oral)   Wt 173 lb 9.6 oz (78.7 kg)   SpO2 99%   BMI 25.64 kg/m  Vitals:   06/09/19 0933  BP: 122/80  Pulse: 94  Temp: 98 F (36.7 C)  TempSrc: Oral  SpO2: 99%  Weight: 173 lb 9.6 oz (78.7 kg)     Physical Exam Constitutional:      Appearance: Normal appearance.   Skin:    General: Skin is warm and dry.     Findings: Erythema and rash present.     Comments: Fluid filled blisters on erythematous base of both forearms.   Neurological:     Mental Status: He is alert and oriented to person, place, and time. Mental status is at baseline.  Psychiatric:        Mood and Affect: Mood normal.        Behavior: Behavior normal.      No results found for any visits on 06/09/19.     Assessment & Plan    1. Poison ivy dermatitis  May message if he needs another round of prednisone after completing this dose.   - predniSONE (STERAPRED UNI-PAK 21 TAB) 10 MG (21) TBPK tablet; Take 6 pills on day 1, take 5 pills on day 2 and so on until complete.  Dispense: 21 tablet; Refill: 0  The entirety of the information documented in the History of Present Illness, Review of Systems and Physical Exam were personally obtained by me. Portions of this information were initially documented by Idelle Jo, CMA and reviewed by me for thoroughness and accuracy.   F/u PRN  Trinna Post, PA-C  [facility]

## 2019-06-09 NOTE — Patient Instructions (Signed)
Do not take ibuprofen or naproxen with these.    Poison Ivy Dermatitis   Poison ivy dermatitis is redness and soreness (inflammation) of the skin. It is caused by a chemical that is found on the leaves of the poison ivy plant. You may also have itching, a rash, and blisters. Symptoms often clear up in 1-2 weeks. You may get this condition by touching a poison ivy plant. You can also get it by touching something that has the chemical on it. This may include animals or objects that have come in contact with the plant. Follow these instructions at home: General instructions  Take or apply over-the-counter and prescription medicines only as told by your doctor.  If you touch poison ivy, wash your skin with soap and cold water right away.  Use hydrocortisone creams or calamine lotion as needed to help with itching.  Take oatmeal baths as needed. Use colloidal oatmeal. You can get this at a pharmacy or grocery store. Follow the instructions on the package.  Do not scratch or rub your skin.  While you have the rash, wash your clothes right after you wear them. Prevention   Know what poison ivy looks like so you can avoid it. This plant has three leaves with flowering branches on a single stem. The leaves are glossy. They have uneven edges that come to a point at the front.  If you have touched poison ivy, wash with soap and water right away. Be sure to wash under your fingernails.  When hiking or camping, wear long pants, a long-sleeved shirt, tall socks, and hiking boots. You can also use a lotion on your skin that helps to prevent contact with the chemical on the plant.  If you think that your clothes or outdoor gear came in contact with poison ivy, rinse them off with a garden hose before you bring them inside your house. Contact a doctor if:  You have open sores in the rash area.  You have more redness, swelling, or pain in the affected area.  You have redness that spreads beyond  the rash area.  You have fluid, blood, or pus coming from the affected area.  You have a fever.  You have a rash over a large area of your body.  You have a rash on your eyes, mouth, or genitals.  Your rash does not get better after a few days. Get help right away if:  Your face swells or your eyes swell shut.  You have trouble breathing.  You have trouble swallowing. This information is not intended to replace advice given to you by your health care provider. Make sure you discuss any questions you have with your health care provider. Document Released: 01/03/2011 Document Revised: 08/25/2018 Document Reviewed: 05/09/2015 Elsevier Interactive Patient Education  2019 [employer].

## 2019-06-16 ENCOUNTER — Other Ambulatory Visit: Payer: Self-pay | Admitting: Family Medicine

## 2019-06-16 DIAGNOSIS — J4540 Moderate persistent asthma, uncomplicated: Secondary | ICD-10-CM

## 2019-06-16 MED ORDER — PROAIR HFA 108 (90 BASE) MCG/ACT IN AERS: INHALATION_SPRAY | RESPIRATORY_TRACT | 1 refills | Status: DC

## 2019-06-16 NOTE — Telephone Encounter (Signed)
[facility] faxed refill request for the following medications:  PROAIR HFA 108 (90 Base) MCG/ACT inhaler   Please advise.  

## 2019-07-12 NOTE — Telephone Encounter (Signed)
Error

## 2019-07-14 ENCOUNTER — Ambulatory Visit: Payer: 59 | Admitting: Neurology

## 2019-07-14 ENCOUNTER — Other Ambulatory Visit: Payer: Self-pay

## 2019-07-14 ENCOUNTER — Encounter: Payer: Self-pay | Admitting: Neurology

## 2019-07-14 ENCOUNTER — Ambulatory Visit: Payer: 59 | Admitting: Nurse Practitioner

## 2019-07-14 VITALS — BP 122/83 | HR 76 | Temp 98.6°F | Ht 70.0 in | Wt 175.0 lb

## 2019-07-14 DIAGNOSIS — G40309 Generalized idiopathic epilepsy and epileptic syndromes, not intractable, without status epilepticus: Secondary | ICD-10-CM | POA: Diagnosis not present

## 2019-07-14 MED ORDER — CLONAZEPAM 1 MG PO TABS: 1.0000 mg | ORAL_TABLET | Freq: Three times a day (TID) | ORAL | 1 refills | Status: DC

## 2019-07-14 NOTE — Progress Notes (Signed)
Reason for visit: Seizures  Jay Francis is an 33 y.o. male  History of present illness:  Jay Francis is 33 year old right-handed white male with a history of anxiety and panic disorder, he has a history of a seizure.  The patient has done well on clonazepam taking 1 mg 3 times daily.  He tolerates the drug quite well, he is actively working, he operates a Teacher, music.  He reports no other significant medical issues that have come up since last seen.  He returns for his annual evaluation.  Past Medical History:  Diagnosis Date  . Panic disorder without agoraphobia   . Seizures ([place])     Past Surgical History:  Procedure Laterality Date  . None    . WISDOM TOOTH EXTRACTION      Family History  Problem Relation Age of Onset  . Healthy Mother   . Allergies Mother   . Melanoma Mother   . Healthy Father   . Melanoma Father   . Seizures Maternal Grandfather   . Healthy Brother     Social history:  reports that he has never smoked. He has never used smokeless tobacco. He reports that he does not drink alcohol or use drugs.    Allergies  Allergen Reactions  . Amoxicillin Diarrhea and Nausea Only    Medications:  Prior to Admission medications   Medication Sig Start Date End Date Taking? Authorizing Provider  BREO ELLIPTA 100-25 MCG/INH AEPB INHALE 1 PUFF BY MOUTH INTO THE LUNGS DAILY 05/13/19  Yes Chrismon, Vickki Muff, PA  clonazePAM (KLONOPIN) 1 MG tablet Take 1 tablet (1 mg total) by mouth 3 (three) times daily. 01/19/19  Yes Dennie Bible, NP  ibuprofen (ADVIL,MOTRIN) 600 MG tablet Take 1 tablet (600 mg total) by mouth every 6 (six) hours as needed. 11/19/17  Yes Law, Alexandra M, PA-C  loratadine (CLARITIN) 10 MG tablet Take 10 mg by mouth daily.   Yes [provider]  predniSONE (STERAPRED UNI-PAK 21 TAB) 10 MG (21) TBPK tablet Take 6 pills on day 1, take 5 pills on day 2 and so on until complete. 06/09/19  Yes Pollak, Adriana M, PA-C  PROAIR HFA 108 (90  Base) MCG/ACT inhaler INHALE 2 PUFFS INTO THE LUNGS EVERY 6 HOURS AS NEEDED FOR WHEEZING OR SHORTNESS OF BREATH 06/16/19  Yes Chrismon, Vickki Muff, PA  Probiotic Product (PRO-BIOTIC BLEND PO) Take 1 tablet by mouth daily.   Yes [provider]    ROS:  Out of a complete 14 system review of symptoms, the patient complains only of the following symptoms, and all other reviewed systems are negative.  Anxiety  Blood pressure 122/83, pulse 76, temperature 98.6 F (37 C), temperature source Temporal, height 5\' 10"  (1.778 m), weight 175 lb (79.4 kg).  Physical Exam  General: The patient is alert and cooperative at the time of the examination.  Skin: No significant peripheral edema is noted.   Neurologic Exam  Mental status: The patient is alert and oriented x 3 at the time of the examination. The patient has apparent normal recent and remote memory, with an apparently normal attention span and concentration ability.   Cranial nerves: Facial symmetry is present. Speech is normal, no aphasia or dysarthria is noted. Extraocular movements are full. Visual fields are full.  Motor: The patient has good strength in all 4 extremities.  Sensory examination: Soft touch sensation is symmetric on the face, arms, and legs.  Coordination: The patient has good finger-nose-finger and  heel-to-shin bilaterally.  Gait and station: The patient has a normal gait. Tandem gait is normal. Romberg is negative. No drift is seen.  Reflexes: Deep tendon reflexes are symmetric.   Assessment/Plan:  1.  History of seizures, well controlled  2.  Anxiety disorder  The last such event occurred in July 2017 after missing several doses of clonazepam.  He otherwise is doing quite well.  He will be given a prescription for his clonazepam and follow-up here in 1 year.  Marlan Palau. Keith  MD 07/14/2019 1:38 PM  [facility] [address] [place], [place] [id number]  Phone  [phone] Fax [phone]

## 2019-07-25 ENCOUNTER — Encounter: Payer: Self-pay | Admitting: Physician Assistant

## 2019-09-16 ENCOUNTER — Other Ambulatory Visit: Payer: Self-pay | Admitting: Family Medicine

## 2019-09-16 DIAGNOSIS — J45909 Unspecified asthma, uncomplicated: Secondary | ICD-10-CM

## 2019-09-28 ENCOUNTER — Other Ambulatory Visit: Payer: Self-pay | Admitting: Family Medicine

## 2019-09-28 DIAGNOSIS — J4540 Moderate persistent asthma, uncomplicated: Secondary | ICD-10-CM

## 2019-11-07 ENCOUNTER — Other Ambulatory Visit: Payer: Self-pay | Admitting: Neurology

## 2019-12-29 ENCOUNTER — Encounter: Payer: Self-pay | Admitting: Physician Assistant

## 2019-12-29 ENCOUNTER — Telehealth: Payer: Self-pay

## 2019-12-29 DIAGNOSIS — J45909 Unspecified asthma, uncomplicated: Secondary | ICD-10-CM

## 2019-12-29 MED ORDER — BREO ELLIPTA 100-25 MCG/INH IN AEPB: INHALATION_SPRAY | RESPIRATORY_TRACT | 1 refills | Status: DC

## 2019-12-29 NOTE — Telephone Encounter (Signed)
Patient send a MyChart message requesting a refill on his Breo inhaler. L.O.V. was on 07/25/2019. Medication send into pharmacy.

## 2020-01-13 MED ORDER — CLONAZEPAM 1 MG PO TABS: 1.0000 mg | ORAL_TABLET | Freq: Three times a day (TID) | ORAL | 0 refills | Status: DC

## 2020-01-13 NOTE — Telephone Encounter (Signed)
  Patient called answer-phone at 12.48 on Friday , requesting an authorization of refill for Klonopin at high dose.  I refilled the medication.  He takes 1 mg TID and received 90 days with refills on this medication. I did order a 90 day supplyMellody Dance,  please review on Monday.  CD

## 2020-01-16 NOTE — Telephone Encounter (Signed)
This medication has been prescribed and refilled by Dr. Anne Hahn for seizure control for >than the past 6 years.

## 2020-01-27 ENCOUNTER — Other Ambulatory Visit: Payer: Self-pay | Admitting: Family Medicine

## 2020-01-27 DIAGNOSIS — J4540 Moderate persistent asthma, uncomplicated: Secondary | ICD-10-CM

## 2020-03-23 ENCOUNTER — Other Ambulatory Visit: Payer: Self-pay | Admitting: Physician Assistant

## 2020-03-23 DIAGNOSIS — J45909 Unspecified asthma, uncomplicated: Secondary | ICD-10-CM

## 2020-04-12 ENCOUNTER — Other Ambulatory Visit: Payer: Self-pay | Admitting: Neurology

## 2020-04-25 ENCOUNTER — Other Ambulatory Visit: Payer: Self-pay | Admitting: Family Medicine

## 2020-04-25 DIAGNOSIS — J45909 Unspecified asthma, uncomplicated: Secondary | ICD-10-CM

## 2020-04-25 DIAGNOSIS — J4540 Moderate persistent asthma, uncomplicated: Secondary | ICD-10-CM

## 2020-06-01 ENCOUNTER — Other Ambulatory Visit: Payer: Self-pay | Admitting: Family Medicine

## 2020-06-01 DIAGNOSIS — J45909 Unspecified asthma, uncomplicated: Secondary | ICD-10-CM

## 2020-06-01 NOTE — Telephone Encounter (Signed)
Requested  medications are  due for refill today It is early by 3 1/2 weeks  Requested medications are on the active medication list yes  Last refill 5/12  Notes to clinic Did not  feel comfortable not filling an inhaler but wanted you to know in case pt is having to use more than usual.

## 2020-07-06 ENCOUNTER — Other Ambulatory Visit: Payer: Self-pay | Admitting: Family Medicine

## 2020-07-06 DIAGNOSIS — J45909 Unspecified asthma, uncomplicated: Secondary | ICD-10-CM

## 2020-07-06 NOTE — Telephone Encounter (Signed)
Requested  medications are  due for refill today yes  Requested medications are on the active medication list yes  Last refill 6/18  Last visit over a year ago  Future visit scheduled No  Notes to clinic Failed protocol due to no visit within 12 months

## 2020-07-07 ENCOUNTER — Encounter: Payer: Self-pay | Admitting: Neurology

## 2020-07-09 ENCOUNTER — Encounter: Payer: Self-pay | Admitting: Family Medicine

## 2020-07-09 NOTE — Progress Notes (Signed)
Established patient visit   Patient: Jay Francis   DOB: May 02, 1986   34 y.o. Male  MRN: [id number] Visit Date: 07/10/2020  Today's healthcare provider: Trey Sailors, PA-C   Chief Complaint  Patient presents with  . Depression  . Anxiety   Subjective    HPI  Patient reports he has been feeling depressed and anxious lately. He reports feeling hopeless, decrease concentration, agitated, nervous and anxious. Patient states that he has been dealing with his symptoms for years now and was too prideful to seek help. Had worsening of mood recently. Family history of depression in brother and father. Trying to sleep, exercise, eat healthy. Currently on 1 mg klonopin TID for seizures and is followed by neurology.   Depression screen [facility] 2/9 07/10/2020 06/18/2018 06/29/2017  Decreased Interest 2 1 1   Down, Depressed, Hopeless 3 1 1   PHQ - 2 Score 5 2 2   Altered sleeping 1 2 1   Tired, decreased energy 1 0 0  Change in appetite 1 0 0  Feeling bad or failure about yourself  3 0 0  Trouble concentrating 2 0 1  Moving slowly or fidgety/restless 0 0 0  Suicidal thoughts 0 0 0  PHQ-9 Score 13 4 4   Difficult doing work/chores Extremely dIfficult Not difficult at all -   GAD 7 : Generalized Anxiety Score 07/10/2020  Nervous, Anxious, on Edge 3  Control/stop worrying 3  Worry too much - different things 2  Trouble relaxing 2  Restless 2  Easily annoyed or irritable 2  Afraid - awful might happen 3  Total GAD 7 Score 17  Anxiety Difficulty Extremely difficult        Medications: Outpatient Medications Prior to Visit  Medication Sig  . albuterol (VENTOLIN HFA) 108 (90 Base) MCG/ACT inhaler INHALE 2 PUFFS BY MOUTH EVERY 6 HOURS AS NEEDED FOR WHEEZING OR SHORTNESS OF BREATH  . BREO ELLIPTA 100-25 MCG/INH AEPB INHALE 1 PUFF BY MOUTH EVERY DAY  . clonazePAM (KLONOPIN) 1 MG tablet TAKE 1 TABLET(1 MG) BY MOUTH THREE TIMES DAILY  . ibuprofen (ADVIL,MOTRIN) 600 MG tablet Take 1 tablet (600  mg total) by mouth every 6 (six) hours as needed.  . loratadine (CLARITIN) 10 MG tablet Take 10 mg by mouth daily.  . Probiotic Product (PRO-BIOTIC BLEND PO) Take 1 tablet by mouth daily.  . [DISCONTINUED] predniSONE (STERAPRED UNI-PAK 21 TAB) 10 MG (21) TBPK tablet Take 6 pills on day 1, take 5 pills on day 2 and so on until complete.   No facility-administered medications prior to visit.    Review of Systems  Constitutional: Negative.   Respiratory: Negative.   Cardiovascular: Negative.   Psychiatric/Behavioral: Positive for agitation and decreased concentration. Negative for behavioral problems, self-injury, sleep disturbance and suicidal ideas. The patient is nervous/anxious.       Objective    BP (!) 132/76 (BP Location: Left Arm, Patient Position: Sitting, Cuff Size: Normal)   Pulse 98   Temp (!) 96.8 F (36 C) (Temporal)   Wt 179 lb 6.4 oz (81.4 kg)   SpO2 98%   BMI 25.74 kg/m    Physical Exam Constitutional:      Appearance: Normal appearance.  Cardiovascular:     Rate and Rhythm: Normal rate.  Pulmonary:     Effort: Pulmonary effort is normal. No respiratory distress.  Skin:    General: Skin is warm and dry.  Neurological:     General: No focal deficit present.  Mental Status: He is alert and oriented to person, place, and time.  Psychiatric:        Mood and Affect: Mood is anxious and depressed.        Behavior: Behavior normal.       No results found for any visits on 07/10/20.  Assessment & Plan    1. Anxiety and depression  Will Start Lexapro 10 mg daily Discussed potential side effects, incl GI upset, sexual dysfunction, increased anxiety, and SI Discussed that it can take 6-8 weeks to reach full efficacy Contracted for safety - no SI/HI Discussed synergistic effects of medications and therapy  - escitalopram (LEXAPRO) 10 MG tablet; Take 1 tablet (10 mg total) by mouth daily.  Dispense: 90 tablet; Refill: 0 - Ambulatory referral to  Psychology  2. Generalized convulsive epilepsy (HCC)   3. Overweight (BMI 25.0-29.9)     Return in about 6 weeks (around 08/21/2020) for anxiety and depression .      ITrey Sailors, PA-C, have reviewed all documentation for this visit. The documentation on 07/11/20 for the exam, diagnosis, procedures, and orders are all accurate and complete.    Maryella Shivers  [facility] [phone] (phone) [phone] (fax)  [facility]

## 2020-07-10 ENCOUNTER — Other Ambulatory Visit: Payer: Self-pay

## 2020-07-10 ENCOUNTER — Encounter: Payer: Self-pay | Admitting: Physician Assistant

## 2020-07-10 ENCOUNTER — Ambulatory Visit: Payer: 59 | Admitting: Physician Assistant

## 2020-07-10 VITALS — BP 132/76 | HR 98 | Temp 96.8°F | Wt 179.4 lb

## 2020-07-10 DIAGNOSIS — F419 Anxiety disorder, unspecified: Secondary | ICD-10-CM

## 2020-07-10 DIAGNOSIS — F329 Major depressive disorder, single episode, unspecified: Secondary | ICD-10-CM

## 2020-07-10 DIAGNOSIS — F32A Depression, unspecified: Secondary | ICD-10-CM

## 2020-07-10 DIAGNOSIS — E663 Overweight: Secondary | ICD-10-CM

## 2020-07-10 DIAGNOSIS — G40309 Generalized idiopathic epilepsy and epileptic syndromes, not intractable, without status epilepticus: Secondary | ICD-10-CM

## 2020-07-10 MED ORDER — ESCITALOPRAM OXALATE 10 MG PO TABS: 10.0000 mg | ORAL_TABLET | Freq: Every day | ORAL | 0 refills | Status: DC

## 2020-07-10 NOTE — Patient Instructions (Signed)
Living With Depression Everyone experiences occasional disappointment, sadness, and loss in their lives. When you are feeling down, blue, or sad for at least 2 weeks in a row, it may mean that you have depression. Depression can affect your thoughts and feelings, relationships, daily activities, and physical health. It is caused by changes in the way your brain functions. If you receive a diagnosis of depression, your health care provider will tell you which type of depression you have and what treatment options are available to you. If you are living with depression, there are ways to help you recover from it and also ways to prevent it from coming back. How to cope with lifestyle changes Coping with stress     Stress is your body's reaction to life changes and events, both good and bad. Stressful situations may include:  Getting married.  The death of a spouse.  Losing a job.  Retiring.  Having a baby. Stress can last just a few hours or it can be ongoing. Stress can play a major role in depression, so it is important to learn both how to cope with stress and how to think about it differently. Talk with your health care provider or a counselor if you would like to learn more about stress reduction. He or she may suggest some stress reduction techniques, such as:  Music therapy. This can include creating music or listening to music. Choose music that you enjoy and that inspires you.  Mindfulness-based meditation. This kind of meditation can be done while sitting or walking. It involves being aware of your normal breaths, rather than trying to control your breathing.  Centering prayer. This is a kind of meditation that involves focusing on a spiritual word or phrase. Choose a word, phrase, or sacred image that is meaningful to you and that brings you peace.  Deep breathing. To do this, expand your stomach and inhale slowly through your nose. Hold your breath for 3-5 seconds, then exhale  slowly, allowing your stomach muscles to relax.  Muscle relaxation. This involves intentionally tensing muscles then relaxing them. Choose a stress reduction technique that fits your lifestyle and personality. Stress reduction techniques take time and practice to develop. Set aside 5-15 minutes a day to do them. Therapists can offer training in these techniques. The training may be covered by some insurance plans. Other things you can do to manage stress include:  Keeping a stress diary. This can help you learn what triggers your stress and ways to control your response.  Understanding what your limits are and saying no to requests or events that lead to a schedule that is too full.  Thinking about how you respond to certain situations. You may not be able to control everything, but you can control how you react.  Adding humor to your life by watching funny films or TV shows.  Making time for activities that help you relax and not feeling guilty about spending your time this way.  Medicines Your health care provider may suggest certain medicines if he or she feels that they will help improve your condition. Avoid using alcohol and other substances that may prevent your medicines from working properly (may interact). It is also important to:  Talk with your pharmacist or health care provider about all the medicines that you take, their possible side effects, and what medicines are safe to take together.  Make it your goal to take part in all treatment decisions (shared decision-making). This includes giving input on   the side effects of medicines. It is best if shared decision-making with your health care provider is part of your total treatment plan. If your health care provider prescribes a medicine, you may not notice the full benefits of it for 4-8 weeks. Most people who are treated for depression need to be on medicine for at least 6-12 months after they feel better. If you are taking  medicines as part of your treatment, do not stop taking medicines without first talking to your health care provider. You may need to have the medicine slowly decreased (tapered) over time to decrease the risk of harmful side effects. Relationships Your health care provider may suggest family therapy along with individual therapy and drug therapy. While there may not be family problems that are causing you to feel depressed, it is still important to make sure your family learns as much as they can about your mental health. Having your family's support can help make your treatment successful. How to recognize changes in your condition Everyone has a different response to treatment for depression. Recovery from major depression happens when you have not had signs of major depression for two months. This may mean that you will start to:  Have more interest in doing activities.  Feel less hopeless than you did 2 months ago.  Have more energy.  Overeat less often, or have better or improving appetite.  Have better concentration. Your health care provider will work with you to decide the next steps in your recovery. It is also important to recognize when your condition is getting worse. Watch for these signs:  Having fatigue or low energy.  Eating too much or too little.  Sleeping too much or too little.  Feeling restless, agitated, or hopeless.  Having trouble concentrating or making decisions.  Having unexplained physical complaints.  Feeling irritable, angry, or aggressive. Get help as soon as you or your family members notice these symptoms coming back. How to get support and help from others How to talk with friends and family members about your condition  Talking to friends and family members about your condition can provide you with one way to get support and guidance. Reach out to trusted friends or family members, explain your symptoms to them, and let them know that you are  working with a health care provider to treat your depression. Financial resources Not all insurance plans cover mental health care, so it is important to check with your insurance carrier. If paying for co-pays or counseling services is a problem, search for a local or county mental health care center. They may be able to offer public mental health care services at low or no cost when you are not able to see a private health care provider. If you are taking medicine for depression, you may be able to get the generic form, which may be less expensive. Some makers of prescription medicines also offer help to patients who cannot afford the medicines they need. Follow these instructions at home:   Get the right amount and quality of sleep.  Cut down on using caffeine, tobacco, alcohol, and other potentially harmful substances.  Try to exercise, such as walking or lifting small weights.  Take over-the-counter and prescription medicines only as told by your health care provider.  Eat a healthy diet that includes plenty of vegetables, fruits, whole grains, low-fat dairy products, and lean protein. Do not eat a lot of foods that are high in solid fats, added sugars, or salt.    Keep all follow-up visits as told by your health care provider. This is important. Contact a health care provider if:  You stop taking your antidepressant medicines, and you have any of these symptoms: ? Nausea. ? Headache. ? Feeling lightheaded. ? Chills and body aches. ? Not being able to sleep (insomnia).  You or your friends and family think your depression is getting worse. Get help right away if:  You have thoughts of hurting yourself or others. If you ever feel like you may hurt yourself or others, or have thoughts about taking your own life, get help right away. You can go to your nearest emergency department or call:  Your local emergency services (911 in the [place]  A suicide crisis helpline, such as the  National Suicide Prevention Lifeline at 1-800-273-8255. This is open 24-hours a day. Summary  If you are living with depression, there are ways to help you recover from it and also ways to prevent it from coming back.  Work with your health care team to create a management plan that includes counseling, stress management techniques, and healthy lifestyle habits. This information is not intended to replace advice given to you by your health care provider. Make sure you discuss any questions you have with your health care provider. Document Revised: 03/25/2019 Document Reviewed: 11/03/2016 Elsevier Patient Education  2020 Elsevier Inc.  

## 2020-07-18 ENCOUNTER — Ambulatory Visit: Payer: 59 | Admitting: Neurology

## 2020-07-18 ENCOUNTER — Other Ambulatory Visit: Payer: Self-pay

## 2020-07-18 ENCOUNTER — Encounter: Payer: Self-pay | Admitting: Neurology

## 2020-07-18 VITALS — BP 134/88 | HR 74 | Ht 70.0 in | Wt 183.0 lb

## 2020-07-18 DIAGNOSIS — G40309 Generalized idiopathic epilepsy and epileptic syndromes, not intractable, without status epilepticus: Secondary | ICD-10-CM | POA: Diagnosis not present

## 2020-07-18 NOTE — Progress Notes (Signed)
PATIENT: Jay Francis DOB: 08-19-1986  REASON FOR VISIT: follow up HISTORY FROM: patient  HISTORY OF PRESENT ILLNESS: Today 07/18/20  Jay Francis is a 34 year old male with history of anxiety, panic disorder, and seizures.  He is on clonazepam 1 mg 3 times daily for seizures.  Initial seizure was in 2013.  Had recurrent seizure in July 2017 after missing 2 doses again.  No recurrent seizure since.  He recently saw his PCP for depression, has been started on Lexapro.  Has seen improvement.  Will be seeing psychiatry.  Works full-time in Data processing manager. Presents today for evaluation unaccompanied.  HISTORY  07/14/2019 Dr. Anne Hahn: Jay Francis is 34 year old right-handed white male with a history of anxiety and panic disorder, he has a history of a seizure.  The patient has done well on clonazepam taking 1 mg 3 times daily.  He tolerates the drug quite well, he is actively working, he operates a Librarian, academic.  He reports no other significant medical issues that have come up since last seen.  He returns for his annual evaluation.  REVIEW OF SYSTEMS: Out of a complete 14 system review of symptoms, the patient complains only of the following symptoms, and all other reviewed systems are negative.  seizure  ALLERGIES: Allergies  Allergen Reactions  . Amoxicillin Diarrhea and Nausea Only    HOME MEDICATIONS: Outpatient Medications Prior to Visit  Medication Sig Dispense Refill  . albuterol (VENTOLIN HFA) 108 (90 Base) MCG/ACT inhaler INHALE 2 PUFFS BY MOUTH EVERY 6 HOURS AS NEEDED FOR WHEEZING OR SHORTNESS OF BREATH 8.5 g 1  . BREO ELLIPTA 100-25 MCG/INH AEPB INHALE 1 PUFF BY MOUTH EVERY DAY 60 each 0  . clonazePAM (KLONOPIN) 1 MG tablet TAKE 1 TABLET(1 MG) BY MOUTH THREE TIMES DAILY 270 tablet 1  . escitalopram (LEXAPRO) 10 MG tablet Take 1 tablet (10 mg total) by mouth daily. 90 tablet 0  . ibuprofen (ADVIL,MOTRIN) 600 MG tablet Take 1 tablet (600 mg total) by mouth every 6 (six) hours as needed. 30  tablet 0  . loratadine (CLARITIN) 10 MG tablet Take 10 mg by mouth daily.    . Probiotic Product (PRO-BIOTIC BLEND PO) Take 1 tablet by mouth daily.     No facility-administered medications prior to visit.    PAST MEDICAL HISTORY: Past Medical History:  Diagnosis Date  . Panic disorder without agoraphobia   . Seizures (HCC)     PAST SURGICAL HISTORY: Past Surgical History:  Procedure Laterality Date  . None    . WISDOM TOOTH EXTRACTION      FAMILY HISTORY: Family History  Problem Relation Age of Onset  . Healthy Mother   . Allergies Mother   . Melanoma Mother   . Healthy Father   . Melanoma Father   . Seizures Maternal Grandfather   . Healthy Brother     SOCIAL HISTORY: Social History   Socioeconomic History  . Marital status: Single    Spouse name: Not on file  . Number of children: 0  . Years of education: Associates  . Highest education level: Not on file  Occupational History  . Occupation: LOGISTICS    Employer: [employer]  Tobacco Use  . Smoking status: Never Smoker  . Smokeless tobacco: Never Used  Vaping Use  . Vaping Use: Never used  Substance and Sexual Activity  . Alcohol use: No  . Drug use: No  . Sexual activity: Yes    Partners: Female  Other Topics Concern  .  Not on file  Social History Narrative   Patient is single.    Patient has no children.    Patient is full-time.    Patient has an Associates degree.   Patient is right handed.    Social Determinants of Health   Financial Resource Strain:   . Difficulty of Paying Living Expenses:   Food Insecurity:   . Worried About Programme researcher, broadcasting/film/video in the Last Year:   . Barista in the Last Year:   Transportation Needs:   . Freight forwarder (Medical):   Marland Kitchen Lack of Transportation (Non-Medical):   Physical Activity:   . Days of Exercise per Week:   . Minutes of Exercise per Session:   Stress:   . Feeling of Stress :   Social Connections:   . Frequency of  Communication with Friends and Family:   . Frequency of Social Gatherings with Friends and Family:   . Attends Religious Services:   . Active Member of Clubs or Organizations:   . Attends Banker Meetings:   Marland Kitchen Marital Status:   Intimate Partner Violence:   . Fear of Current or Ex-Partner:   . Emotionally Abused:   Marland Kitchen Physically Abused:   . Sexually Abused:    PHYSICAL EXAM  Vitals:   07/18/20 1407  BP: 134/88  Pulse: 74  Weight: 183 lb (83 kg)  Height: 5\' 10"  (1.778 m)   Body mass index is 26.26 kg/m.  Generalized: Well developed, in no acute distress   Neurological examination  Mentation: Alert oriented to time, place, history taking. Follows all commands speech and language fluent Cranial nerve II-XII: Pupils were equal round reactive to light. Extraocular movements were full, visual field were full on confrontational test. Facial sensation and strength were normal. Head turning and shoulder shrug  were normal and symmetric. Motor: The motor testing reveals 5 over 5 strength of all 4 extremities. Good symmetric motor tone is noted throughout.  Sensory: Sensory testing is intact to soft touch on all 4 extremities. No evidence of extinction is noted.  Coordination: Cerebellar testing reveals good finger-nose-finger and heel-to-shin bilaterally.  Gait and station: Gait is normal. Tandem gait is normal. Romberg is negative. No drift is seen.  Reflexes: Deep tendon reflexes are symmetric and normal bilaterally.   DIAGNOSTIC DATA (LABS, IMAGING, TESTING) - I reviewed patient records, labs, notes, testing and imaging myself where available.  Lab Results  Component Value Date   WBC 6.2 05/03/2019   HGB 13.4 05/03/2019   HCT 40.0 05/03/2019   MCV 86 05/03/2019   PLT 235 05/03/2019      Component Value Date/Time   NA 140 05/03/2019 0909   K 4.7 05/03/2019 0909   CL 106 05/03/2019 0909   CO2 22 05/03/2019 0909   GLUCOSE 99 05/03/2019 0909   GLUCOSE 130 (H)  11/19/2017 0858   BUN 18 05/03/2019 0909   CREATININE 0.96 05/03/2019 0909   CALCIUM 9.7 05/03/2019 0909   PROT 6.9 05/03/2019 0909   ALBUMIN 5.0 05/03/2019 0909   AST 17 05/03/2019 0909   ALT 25 05/03/2019 0909   ALKPHOS 45 05/03/2019 0909   BILITOT 0.3 05/03/2019 0909   GFRNONAA 103 05/03/2019 0909   GFRAA 120 05/03/2019 0909   Lab Results  Component Value Date   CHOL 191 05/03/2019   HDL 52 05/03/2019   LDLCALC 126 (H) 05/03/2019   TRIG 67 05/03/2019   CHOLHDL 3.7 05/03/2019   No results found for:  HGBA1C No results found for: VITAMINB12 Lab Results  Component Value Date   TSH 2.800 06/18/2018      ASSESSMENT AND PLAN 34 y.o. year old male  has a past medical history of Panic disorder without agoraphobia and Seizures (HCC). here with:  1.  History of seizures, well controlled 2.  Anxiety disorder  His seizures remain well controlled, he will remain on clonazepam 1 mg 3 times a day.  He is now taking Lexapro for depression.  Has 1 refill left on his clonazepam, is considering mail-order.  He will follow-up in 1 year or sooner if needed.  I spent 20 minutes of face-to-face and non-face-to-face time with patient.  This included previsit chart review, lab review, study review, order entry, electronic health record documentation, patient education.  Margie Ege, AGNP-C, DNP 07/18/2020, 2:45 PM [facility] [address], [address] [place], [place] [address] [phone]

## 2020-07-18 NOTE — Patient Instructions (Signed)
Continue current medications Call for recurrent seizure  See you back in 1 year

## 2020-07-18 NOTE — Progress Notes (Signed)
I have read the note, and I agree with the clinical assessment and plan.  Jadene Stemmer K Earnie Bechard   

## 2020-08-05 ENCOUNTER — Other Ambulatory Visit: Payer: Self-pay | Admitting: Family Medicine

## 2020-08-05 DIAGNOSIS — J45909 Unspecified asthma, uncomplicated: Secondary | ICD-10-CM

## 2020-08-05 DIAGNOSIS — J4540 Moderate persistent asthma, uncomplicated: Secondary | ICD-10-CM

## 2020-08-21 ENCOUNTER — Ambulatory Visit (INDEPENDENT_AMBULATORY_CARE_PROVIDER_SITE_OTHER): Payer: 59 | Admitting: Physician Assistant

## 2020-08-21 ENCOUNTER — Encounter: Payer: Self-pay | Admitting: Physician Assistant

## 2020-08-21 ENCOUNTER — Other Ambulatory Visit: Payer: Self-pay

## 2020-08-21 VITALS — BP 134/89 | HR 87 | Temp 98.2°F | Wt 183.6 lb

## 2020-08-21 DIAGNOSIS — F419 Anxiety disorder, unspecified: Secondary | ICD-10-CM

## 2020-08-21 DIAGNOSIS — F329 Major depressive disorder, single episode, unspecified: Secondary | ICD-10-CM

## 2020-08-21 DIAGNOSIS — F32A Depression, unspecified: Secondary | ICD-10-CM | POA: Insufficient documentation

## 2020-08-21 NOTE — Patient Instructions (Signed)

## 2020-08-21 NOTE — Progress Notes (Signed)
Established patient visit   Patient: Jay Francis   DOB: 1986/06/05   34 y.o. Male  MRN: [id number] Visit Date: 08/21/2020  Today's healthcare provider: Trey Sailors, PA-C   Chief Complaint  Patient presents with  . Depression  . Anxiety  I,Adriana M Pollak,acting as a scribe for Trey Sailors, PA-C.,have documented all relevant documentation on the behalf of Trey Sailors, PA-C,as directed by  Trey Sailors, PA-C while in the presence of Trey Sailors, PA-C.  Subjective    HPI  Depression & Anxiety, Follow-up  He  was last seen for this 6 weeks ago. Changes made at last visit include started on Lexapro 10 mg daily and referred to Psychology for counseling.    He reports good compliance with treatment. He is not having side effects.   He reports good tolerance of treatment. Current symptoms include: none He feels he is Improved since last visit. Really starting feeling effects at 4th week.  Patient has started new job in the interim which requires a lot of traveling, recently in .   Depression screen [facility] 2/9 08/21/2020 07/10/2020 06/18/2018  Decreased Interest 1 2 1   Down, Depressed, Hopeless 1 3 1   PHQ - 2 Score 2 5 2   Altered sleeping 0 1 2  Tired, decreased energy 0 1 0  Change in appetite 0 1 0  Feeling bad or failure about yourself  0 3 0  Trouble concentrating 0 2 0  Moving slowly or fidgety/restless 0 0 0  Suicidal thoughts 0 0 0  PHQ-9 Score 2 13 4   Difficult doing work/chores Not difficult at all Extremely dIfficult Not difficult at all    ----------------------------------------------------------------------------------------- GAD 7 : Generalized Anxiety Score 08/21/2020 07/10/2020  Nervous, Anxious, on Edge 1 3  Control/stop worrying 1 3  Worry too much - different things 1 2  Trouble relaxing 1 2  Restless 0 2  Easily annoyed or irritable 0 2  Afraid - awful might happen 1 3  Total GAD 7 Score 5 17  Anxiety Difficulty Somewhat  difficult Extremely difficult          Medications: Outpatient Medications Prior to Visit  Medication Sig  . albuterol (VENTOLIN HFA) 108 (90 Base) MCG/ACT inhaler INHALE 2 PUFFS BY MOUTH EVERY 6 HOURS AS NEEDED FOR WHEEZING OR SHORTNESS OF BREATH  . BREO ELLIPTA 100-25 MCG/INH AEPB INHALE 1 PUFF BY MOUTH EVERY DAY  . clonazePAM (KLONOPIN) 1 MG tablet TAKE 1 TABLET(1 MG) BY MOUTH THREE TIMES DAILY  . escitalopram (LEXAPRO) 10 MG tablet Take 1 tablet (10 mg total) by mouth daily.  ibuprofen (ADVIL,MOTRIN) 600 MG tablet Take 1 tablet (600 mg total) by mouth every 6 (six) hours as needed.  . loratadine (CLARITIN) 10 MG tablet Take 10 mg by mouth daily.  . Probiotic Product (PRO-BIOTIC BLEND PO) Take 1 tablet by mouth daily.   No facility-administered medications prior to visit.    Review of Systems  Constitutional: Negative.   Respiratory: Negative.   Psychiatric/Behavioral: Negative.       Objective    BP 134/89 (BP Location: Right Arm, Patient Position: Sitting, Cuff Size: Normal)   Pulse 87   Temp 98.2 F (36.8 C) (Oral)   Wt 183 lb 9.6 oz (83.3 kg)   BMI 26.34 kg/m    Physical Exam Constitutional:      Appearance: Normal appearance.  Cardiovascular:     Rate and Rhythm: Normal rate and regular rhythm.  Heart sounds: Normal heart sounds.  Pulmonary:     Effort: Pulmonary effort is normal.     Breath sounds: Normal breath sounds.  Skin:    General: Skin is warm and dry.  Neurological:     Mental Status: He is alert and oriented to person, place, and time. Mental status is at baseline.  Psychiatric:        Mood and Affect: Mood normal.        Behavior: Behavior normal.       No results found for any visits on 08/21/20.  Assessment & Plan    1. Anxiety and depression  Doing well on Lexparo 10 mg daily, continue medicines.    No follow-ups on file.      ITrey Sailors, PA-C, have reviewed all documentation for this visit. The documentation  on 08/21/20 for the exam, diagnosis, procedures, and orders are all accurate and complete.  The entirety of the information documented in the History of Present Illness, Review of Systems and Physical Exam were personally obtained by me. Portions of this information were initially documented by [facility] and reviewed by me for thoroughness and accuracy.     Maryella Shivers  [facility] [phone] (phone) [phone] (fax)  [facility]

## 2020-09-30 ENCOUNTER — Other Ambulatory Visit: Payer: Self-pay | Admitting: Family Medicine

## 2020-09-30 ENCOUNTER — Other Ambulatory Visit: Payer: Self-pay | Admitting: Physician Assistant

## 2020-09-30 DIAGNOSIS — J45909 Unspecified asthma, uncomplicated: Secondary | ICD-10-CM

## 2020-09-30 DIAGNOSIS — F32A Depression, unspecified: Secondary | ICD-10-CM

## 2020-09-30 DIAGNOSIS — F419 Anxiety disorder, unspecified: Secondary | ICD-10-CM

## 2020-09-30 NOTE — Telephone Encounter (Signed)
Requested Prescriptions  Pending Prescriptions Disp Refills   BREO ELLIPTA 100-25 MCG/INH AEPB [Pharmacy Med Name: BREO ELLIPTA 100-25MCG ORAL INH(30)] 60 each 0    Sig: INHALE 1 PUFF BY MOUTH EVERY DAY     Pulmonology:  Combination Products Passed - 09/30/2020  6:22 PM      Passed - Valid encounter within last 12 months    Recent Outpatient Visits          1 month ago Anxiety and depression   [facility] [place], Lavella Hammock, PA-C   2 months ago Anxiety and depression   [facility] [place], Lavella Hammock, [place]   1 year ago Poison ivy dermatitis   [facility] [place], [place], PA-C   1 year ago Seizures [facility])   [facility] [place], Jodell Cipro, [place]   1 year ago Hypercholesterolemia   [facility], Jodell Cipro, [place]      Future Appointments            In 4 months Jodi Marble, Lavella Hammock, PA-C [facility]

## 2020-09-30 NOTE — Telephone Encounter (Signed)
Requested Prescriptions  Pending Prescriptions Disp Refills  . escitalopram (LEXAPRO) 10 MG tablet [Pharmacy Med Name: ESCITALOPRAM 10MG  TABLETS] 90 tablet 1    Sig: TAKE 1 TABLET(10 MG) BY MOUTH DAILY     Psychiatry:  Antidepressants - SSRI Passed - 09/30/2020 12:17 PM      Passed - Completed PHQ-2 or PHQ-9 in the last 360 days.      Passed - Valid encounter within last 6 months    Recent Outpatient Visits          1 month ago Anxiety and depression   [facility], Trey Sailors   2 months ago Anxiety and depression   [facility] [place], [place], Lavella Hammock   1 year ago Poison ivy dermatitis   [facility] [place], [place], [place]   1 year ago Seizures [facility])   [facility] [place], [facility], Jodell Cipro   1 year ago Hypercholesterolemia   [place], [facility], Jodell Cipro      Future Appointments            In 4 months [place], Jodi Marble, PA-C Lavella Hammock, [facility]

## 2020-10-10 ENCOUNTER — Other Ambulatory Visit: Payer: Self-pay | Admitting: Neurology

## 2020-10-30 ENCOUNTER — Other Ambulatory Visit: Payer: Self-pay | Admitting: Physician Assistant

## 2020-10-30 DIAGNOSIS — J45909 Unspecified asthma, uncomplicated: Secondary | ICD-10-CM

## 2020-10-30 NOTE — Telephone Encounter (Signed)
Requested Prescriptions  Pending Prescriptions Disp Refills   BREO ELLIPTA 100-25 MCG/INH AEPB [Pharmacy Med Name: BREO ELLIPTA 100-25MCG ORAL INH(30)] 60 each 0    Sig: INHALE 1 PUFF BY MOUTH EVERY DAY     Pulmonology:  Combination Products Passed - 10/30/2020  6:11 PM      Passed - Valid encounter within last 12 months    Recent Outpatient Visits          2 months ago Anxiety and depression   [facility] [place], [place], PA-C   3 months ago Anxiety and depression   [facility] [place], Lavella Hammock, [place]   1 year ago Poison ivy dermatitis   [facility] [place], [place], PA-C   1 year ago Seizures [facility])   [facility] [place], Jodell Cipro, [place]   1 year ago Hypercholesterolemia   [facility], Jod[place], [place]      Future Appointments            In 3 months Jodi Marble, Lavella [place], PA-C [facility]

## 2021-01-08 ENCOUNTER — Other Ambulatory Visit: Payer: Self-pay | Admitting: Physician Assistant

## 2021-01-08 DIAGNOSIS — J45909 Unspecified asthma, uncomplicated: Secondary | ICD-10-CM

## 2021-02-19 NOTE — Progress Notes (Signed)
Complete physical exam   Patient: Jay Francis   DOB: 1986-01-25   35 y.o. Male  MRN: [id number] Visit Date: 02/20/2021  Today's healthcare provider: Trey Sailors, PA-C   Chief Complaint  Patient presents with  . Annual Exam  . Anxiety  I,Jay Francis M [name],acting as a scribe for Trey Sailors, PA-C.,have documented all relevant documentation on the behalf of Trey Sailors, PA-C,as directed by  Trey Sailors, PA-C while in the presence of Trey Sailors, PA-C.  Subjective    Benjimen Francis is a 35 y.o. male who presents today for a complete physical exam.  He reports consuming a keto/ healthy diet. Home exercise routine includes walking. He generally feels well. He reports sleeping well. He does have additional problems to discuss today.  HPI   He got a new puppy, a [place]. Is having to use some flonase.   Anxiety & Depression, Follow-up  He was last seen for anxiety and depression 6 months ago. Changes made at last visit include continue current medication.   He reports good compliance with treatment. He reports excellent tolerance of treatment. He is not having side effects.   He feels his anxiety is moderate and Unchanged since last visit. Reports some days he has difficulty managing anxiety. Not currently in counseling.   Symptoms: No chest pain No difficulty concentrating  No dizziness No fatigue  No feelings of losing control No insomnia  No irritable No palpitations  No panic attacks No racing thoughts  No shortness of breath No sweating  No tremors/shakes    GAD-7 Results GAD-7 Generalized Anxiety Disorder Screening Tool 02/20/2021 08/21/2020 07/10/2020  1. Feeling Nervous, Anxious, or on Edge 0 1 3  2. Not Being Able to Stop or Control Worrying 0 1 3  3. Worrying Too Much About Different Things 0 1 2  4. Trouble Relaxing 1 1 2   5. Being So Restless it's Hard To Sit Still 0 0 2  6. Becoming Easily Annoyed or Irritable 0 0 2  7. Feeling Afraid As If  Something Awful Might Happen 1 1 3   Total GAD-7 Score 2 5 17   Difficulty At Work, Home, or Getting  Along With Others? Not difficult at all Somewhat difficult Extremely difficult    PHQ-9 Scores PHQ9 SCORE ONLY 02/20/2021 08/21/2020 07/10/2020  PHQ-9 Total Score 3 2 13     ---------------------------------------------------------------------------------------------------   Past Medical History:  Diagnosis Date  . Panic disorder without agoraphobia   . Seizures (HCC)    Past Surgical History:  Procedure Laterality Date  . None    . WISDOM TOOTH EXTRACTION     Social History   Socioeconomic History  . Marital status: Married    Spouse name: Not on file  . Number of children: 0  . Years of education: Associates  . Highest education level: Not on file  Occupational History  . Occupation: LOGISTICS    Employer: [employer]  Tobacco Use  . Smoking status: Never Smoker  . Smokeless tobacco: Never Used  Vaping Use  . Vaping Use: Never used  Substance and Sexual Activity  . Alcohol use: No  . Drug use: No  . Sexual activity: Yes    Partners: Female  Other Topics Concern  . Not on file  Social History Narrative   Patient is single.    Patient has no children.    Patient is full-time.    Patient has an Associates degree.   Patient is  right handed.    Social Determinants of Health   Financial Resource Strain: Not on file  Food Insecurity: Not on file  Transportation Needs: Not on file  Physical Activity: Not on file  Stress: Not on file  Social Connections: Not on file  Intimate Partner Violence: Not on file   Family Status  Relation Name Status  . Mother  Alive  . Father  Alive  . MGF  (Not Specified)  . Brother  Alive   Family History  Problem Relation Age of Onset  . Healthy Mother   . Allergies Mother   . Melanoma Mother   . Healthy Father   . Melanoma Father   . Seizures Maternal Grandfather   . Healthy Brother    Allergies  Allergen  Reactions  . Amoxicillin Diarrhea and Nausea Only    Patient Care Team: Maryella Shivers as PCP - General (Physician Assistant) Trey Sailors, PA-C as Physician Assistant (Physician Assistant)   Medications: Outpatient Medications Prior to Visit  Medication Sig  . albuterol (VENTOLIN HFA) 108 (90 Base) MCG/ACT inhaler INHALE 2 PUFFS BY MOUTH EVERY 6 HOURS AS NEEDED FOR WHEEZING OR SHORTNESS OF BREATH  . clonazePAM (KLONOPIN) 1 MG tablet TAKE 1 TABLET(1 MG) BY MOUTH THREE TIMES DAILY  . ibuprofen (ADVIL,MOTRIN) 600 MG tablet Take 1 tablet (600 mg total) by mouth every 6 (six) hours as needed.  . loratadine (CLARITIN) 10 MG tablet Take 10 mg by mouth daily.  . Probiotic Product (PRO-BIOTIC BLEND PO) Take 1 tablet by mouth daily.  . [DISCONTINUED] BREO ELLIPTA 100-25 MCG/INH AEPB INHALE 1 PUFF BY MOUTH EVERY DAY  . [DISCONTINUED] escitalopram (LEXAPRO) 10 MG tablet TAKE 1 TABLET(10 MG) BY MOUTH DAILY   No facility-administered medications prior to visit.    Review of Systems  Constitutional: Negative.   HENT: Negative.   Eyes: Negative.   Respiratory: Negative.   Cardiovascular: Negative.   Gastrointestinal: Negative.   Endocrine: Negative.   Genitourinary: Negative.   Musculoskeletal: Negative.   Skin: Negative.   Allergic/Immunologic: Negative.   Neurological: Negative.   Hematological: Negative.   Psychiatric/Behavioral: Negative for agitation, decreased concentration, self-injury, sleep disturbance and suicidal ideas. The patient is not nervous/anxious.       Objective    BP 117/89 (BP Location: Left Arm, Patient Position: Sitting, Cuff Size: Normal)   Pulse 88   Temp 98.6 F (37 C) (Oral)   Ht 5\' 9"  (1.753 m)   Wt 186 lb 1.6 oz (84.4 kg)   SpO2 100%   BMI 27.48 kg/m    Physical Exam Constitutional:      Appearance: Normal appearance.  HENT:     Right Ear: Tympanic membrane, ear canal and external ear normal.     Left Ear: Tympanic membrane, ear  canal and external ear normal.  Cardiovascular:     Rate and Rhythm: Normal rate and regular rhythm.     Pulses: Normal pulses.     Heart sounds: Normal heart sounds.  Pulmonary:     Effort: Pulmonary effort is normal.     Breath sounds: Normal breath sounds.  Abdominal:     General: Abdomen is flat. Bowel sounds are normal.     Palpations: Abdomen is soft.  Skin:    General: Skin is warm and dry.  Neurological:     General: No focal deficit present.     Mental Status: He is alert and oriented to person, place, and time.  Psychiatric:  Mood and Affect: Mood normal.        Behavior: Behavior normal.       Last depression screening scores PHQ 2/9 Scores 02/20/2021 08/21/2020 07/10/2020  PHQ - 2 Score 2 2 5   PHQ- 9 Score 3 2 13    Last fall risk screening Fall Risk  02/20/2021  Falls in the past year? 0  Number falls in past yr: 0  Injury with Fall? 0  Risk for fall due to : No Fall Risks  Follow up Falls evaluation completed   Last Audit-C alcohol use screening Alcohol Use Disorder Test (AUDIT) 02/20/2021  1. How often do you have a drink containing alcohol? 0  2. How many drinks containing alcohol do you have on a typical day when you are drinking? 0  3. How often do you have six or more drinks on one occasion? 0  AUDIT-C Score 0   A score of 3 or more in women, and 4 or more in men indicates increased risk for alcohol abuse, EXCEPT if all of the points are from question 1   No results found for any visits on 02/20/21.  Assessment & Plan    Routine Health Maintenance and Physical Exam  Exercise Activities and Dietary recommendations Goals   None     Immunization History  Administered Date(s) Administered  . Tdap 06/18/2018    Health Maintenance  Topic Date Due  . Hepatitis C Screening  Never done  . COVID-19 Vaccine (1) Never done  . INFLUENZA VACCINE  03/14/2021 (Originally 07/15/2020)  . TETANUS/TDAP  06/18/2028  . HIV Screening  Completed  . HPV  VACCINES  Aged Out    Discussed health benefits of physical activity, and encouraged him to engage in regular exercise appropriate for his age and condition.  1. Annual physical exam  - TSH - Lipid panel - Comprehensive metabolic panel - CBC with Differential/Platelet  2. Need for hepatitis C screening test  - Hepatitis C antibody  3. Anxiety and depression  Continue Lexapro 10 mg daily.   4. Mild intermittent asthma without complication  Continue Breo daily.   Return in about 1 year (around 02/20/2022) for CPE and chronic .     I09/04/2028, PA-C, have reviewed all documentation for this visit. The documentation on 02/20/21 for the exam, diagnosis, procedures, and orders are all accurate and complete.  The entirety of the information documented in the History of Present Illness, Review of Systems and Physical Exam were personally obtained by me. Portions of this information were initially documented by [facility] and reviewed by me for thoroughness and accuracy.     04/22/21  [facility] [phone] (phone) [phone] (fax)  [facility]

## 2021-02-20 ENCOUNTER — Ambulatory Visit: Payer: Commercial Managed Care - PPO | Admitting: Physician Assistant

## 2021-02-20 ENCOUNTER — Other Ambulatory Visit: Payer: Self-pay

## 2021-02-20 VITALS — BP 117/89 | HR 88 | Temp 98.6°F | Ht 69.0 in | Wt 186.1 lb

## 2021-02-20 DIAGNOSIS — G40309 Generalized idiopathic epilepsy and epileptic syndromes, not intractable, without status epilepticus: Secondary | ICD-10-CM | POA: Diagnosis not present

## 2021-02-20 DIAGNOSIS — Z0000 Encounter for general adult medical examination without abnormal findings: Secondary | ICD-10-CM | POA: Diagnosis not present

## 2021-02-20 DIAGNOSIS — J4520 Mild intermittent asthma, uncomplicated: Secondary | ICD-10-CM

## 2021-02-20 DIAGNOSIS — J45909 Unspecified asthma, uncomplicated: Secondary | ICD-10-CM

## 2021-02-20 DIAGNOSIS — F419 Anxiety disorder, unspecified: Secondary | ICD-10-CM

## 2021-02-20 DIAGNOSIS — Z1159 Encounter for screening for other viral diseases: Secondary | ICD-10-CM

## 2021-02-20 DIAGNOSIS — F32A Depression, unspecified: Secondary | ICD-10-CM

## 2021-02-20 MED ORDER — ESCITALOPRAM OXALATE 10 MG PO TABS: ORAL_TABLET | ORAL | 3 refills | Status: DC

## 2021-02-20 MED ORDER — BREO ELLIPTA 100-25 MCG/INH IN AEPB: INHALATION_SPRAY | RESPIRATORY_TRACT | 11 refills | Status: DC

## 2021-02-20 NOTE — Patient Instructions (Signed)

## 2021-02-21 LAB — CBC WITH DIFFERENTIAL/PLATELET
Basophils Absolute: 0 10*3/uL (ref 0.0–0.2)
Basos: 1 %
EOS (ABSOLUTE): 0 10*3/uL (ref 0.0–0.4)
Eos: 0 %
Hematocrit: 42.8 % (ref 37.5–51.0)
Hemoglobin: 14.6 g/dL (ref 13.0–17.7)
Immature Grans (Abs): 0 10*3/uL (ref 0.0–0.1)
Immature Granulocytes: 0 %
Lymphocytes Absolute: 1.7 10*3/uL (ref 0.7–3.1)
Lymphs: 27 %
MCH: 29.3 pg (ref 26.6–33.0)
MCHC: 34.1 g/dL (ref 31.5–35.7)
MCV: 86 fL (ref 79–97)
Monocytes Absolute: 0.6 10*3/uL (ref 0.1–0.9)
Monocytes: 9 %
Neutrophils Absolute: 3.9 10*3/uL (ref 1.4–7.0)
Neutrophils: 63 %
Platelets: 251 10*3/uL (ref 150–450)
RBC: 4.99 x10E6/uL (ref 4.14–5.80)
RDW: 12.1 % (ref 11.6–15.4)
WBC: 6.2 10*3/uL (ref 3.4–10.8)

## 2021-02-21 LAB — LIPID PANEL
Chol/HDL Ratio: 4.8 ratio (ref 0.0–5.0)
Cholesterol, Total: 233 mg/dL — ABNORMAL HIGH (ref 100–199)
HDL: 49 mg/dL (ref >39)
LDL Chol Calc (NIH): 172 mg/dL — ABNORMAL HIGH (ref 0–99)
Triglycerides: 69 mg/dL (ref 0–149)
VLDL Cholesterol Cal: 12 mg/dL (ref 5–40)

## 2021-02-21 LAB — COMPREHENSIVE METABOLIC PANEL
ALT: 28 IU/L (ref 0–44)
AST: 21 IU/L (ref 0–40)
Albumin/Globulin Ratio: 2.3 — ABNORMAL HIGH (ref 1.2–2.2)
Albumin: 5.3 g/dL — ABNORMAL HIGH (ref 4.0–5.0)
Alkaline Phosphatase: 45 IU/L (ref 44–121)
BUN/Creatinine Ratio: 10 (ref 9–20)
BUN: 11 mg/dL (ref 6–20)
Bilirubin Total: 0.5 mg/dL (ref 0.0–1.2)
CO2: 22 mmol/L (ref 20–29)
Calcium: 10.4 mg/dL — ABNORMAL HIGH (ref 8.7–10.2)
Chloride: 102 mmol/L (ref 96–106)
Creatinine, Ser: 1.11 mg/dL (ref 0.76–1.27)
Globulin, Total: 2.3 g/dL (ref 1.5–4.5)
Glucose: 94 mg/dL (ref 65–99)
Potassium: 4.8 mmol/L (ref 3.5–5.2)
Sodium: 140 mmol/L (ref 134–144)
Total Protein: 7.6 g/dL (ref 6.0–8.5)
eGFR: 89 mL/min/{1.73_m2} (ref >59)

## 2021-02-21 LAB — TSH: TSH: 2.11 u[IU]/mL (ref 0.450–4.500)

## 2021-02-21 LAB — HEPATITIS C ANTIBODY: Hep C Virus Ab: 0.1 s/co ratio (ref 0.0–0.9)

## 2021-04-15 ENCOUNTER — Encounter: Payer: Self-pay | Admitting: Neurology

## 2021-04-15 MED ORDER — CLONAZEPAM 1 MG PO TABS: ORAL_TABLET | ORAL | 1 refills | Status: DC

## 2021-06-13 ENCOUNTER — Encounter: Payer: Self-pay | Admitting: Neurology

## 2021-06-13 ENCOUNTER — Telehealth: Payer: Self-pay

## 2021-06-13 DIAGNOSIS — F419 Anxiety disorder, unspecified: Secondary | ICD-10-CM

## 2021-06-13 DIAGNOSIS — F32A Depression, unspecified: Secondary | ICD-10-CM

## 2021-06-13 MED ORDER — ESCITALOPRAM OXALATE 10 MG PO TABS: 10.0000 mg | ORAL_TABLET | Freq: Every day | ORAL | 1 refills | Status: DC

## 2021-06-13 NOTE — Telephone Encounter (Signed)
[facility] faxed refill request for the following medications:  escitalopram (LEXAPRO) 10 MG tablet   Please advise.  

## 2021-06-18 NOTE — Telephone Encounter (Signed)
Per [place] narcotic registry, this prescription was filled at the pharmacy on 06/13/21.

## 2021-07-12 ENCOUNTER — Other Ambulatory Visit: Payer: Self-pay | Admitting: Family Medicine

## 2021-07-12 DIAGNOSIS — J4540 Moderate persistent asthma, uncomplicated: Secondary | ICD-10-CM

## 2021-07-18 ENCOUNTER — Ambulatory Visit: Payer: 59 | Admitting: Neurology

## 2021-08-07 ENCOUNTER — Other Ambulatory Visit: Payer: Self-pay

## 2021-08-07 ENCOUNTER — Ambulatory Visit: Payer: Commercial Managed Care - PPO | Admitting: Neurology

## 2021-08-07 ENCOUNTER — Encounter: Payer: Self-pay | Admitting: Neurology

## 2021-08-07 VITALS — BP 125/85 | HR 65 | Ht 69.0 in | Wt 172.4 lb

## 2021-08-07 DIAGNOSIS — F419 Anxiety disorder, unspecified: Secondary | ICD-10-CM | POA: Diagnosis not present

## 2021-08-07 DIAGNOSIS — G40409 Other generalized epilepsy and epileptic syndromes, not intractable, without status epilepticus: Secondary | ICD-10-CM

## 2021-08-07 DIAGNOSIS — F32A Depression, unspecified: Secondary | ICD-10-CM

## 2021-08-07 NOTE — Progress Notes (Signed)
Reason for visit: Anxiety disorder, seizures  Jay Francis is an 35 y.o. male  History of present illness:  Jay Francis is a 35 year old right-handed white male with a history of an anxiety disorder and panic disorder and a history of seizures.  He has had 2 generalized tonic-clonic seizures lifetime, the last one was around 2017.  On clonazepam, he has been able to function much better and has had relatively good control of his seizures.  The patient takes 1 mg 3 times daily, he does not have any drowsiness or gait instability on the medication.  He sleeps well at night.  He still has occasional panic attacks, but he has learned how to control them better.  He continues to work and functions well in this regard.  He works 3 days at home and 2 days in the office which helps alleviate some of the stress.  He has been engaged in regular exercise.  He has lost about 20 pounds of weight.  Overall, he feels much better.  He returns for an evaluation.  Past Medical History:  Diagnosis Date   Panic disorder without agoraphobia    Seizures (HCC)     Past Surgical History:  Procedure Laterality Date   None     WISDOM TOOTH EXTRACTION      Family History  Problem Relation Age of Onset   Healthy Mother    Allergies Mother    Melanoma Mother    Healthy Father    Melanoma Father    Healthy Brother    Seizures Maternal Grandfather     Social history:  reports that he has never smoked. He has never used smokeless tobacco. He reports that he does not drink alcohol and does not use drugs.    Allergies  Allergen Reactions   Amoxicillin Diarrhea and Nausea Only    Medications:  Prior to Admission medications   Medication Sig Start Date End Date Taking? Authorizing Provider  albuterol (VENTOLIN HFA) 108 (90 Base) MCG/ACT inhaler INHALE 2 PUFFS BY MOUTH EVERY 6 HOURS AS NEEDED FOR WHEEZING OR SHORTNESS OF BREATH 07/12/21  Yes Chrismon, Jodell Cipro, PA-C  clonazePAM (KLONOPIN) 1 MG tablet TAKE 1  TABLET(1 MG) BY MOUTH THREE TIMES DAILY 04/15/21  Yes Glean Salvo, NP  escitalopram (LEXAPRO) 10 MG tablet Take 1 tablet (10 mg total) by mouth daily. 06/13/21  Yes Malva Limes, MD  ibuprofen (ADVIL,MOTRIN) 600 MG tablet Take 1 tablet (600 mg total) by mouth every 6 (six) hours as needed. 11/19/17  Yes Law, Alexandra M, PA-C  loratadine (CLARITIN) 10 MG tablet Take 10 mg by mouth daily.   Yes [provider]  Probiotic Product (PRO-BIOTIC BLEND PO) Take 1 tablet by mouth daily.   Yes [provider]  fluticasone furoate-vilanterol (BREO ELLIPTA) 100-25 MCG/INH AEPB Take one puff daily 02/20/21   Trey Sailors, PA-C    ROS:  Out of a complete 14 system review of symptoms, the patient complains only of the following symptoms, and all other reviewed systems are negative.  Seizures Anxiety  Blood pressure 125/85, pulse 65, height 5\' 9"  (1.753 m), weight 172 lb 6.4 oz (78.2 kg).  Physical Exam  General: The patient is alert and cooperative at the time of the examination.  Skin: No significant peripheral edema is noted.   Neurologic Exam  Mental status: The patient is alert and oriented x 3 at the time of the examination. The patient has apparent normal recent and remote memory,  with an apparently normal attention span and concentration ability.   Cranial nerves: Facial symmetry is present. Speech is normal, no aphasia or dysarthria is noted. Extraocular movements are full. Visual fields are full.  Motor: The patient has good strength in all 4 extremities.  Sensory examination: Soft touch sensation is symmetric on the face, arms, and legs.  Coordination: The patient has good finger-nose-finger and heel-to-shin bilaterally.  Gait and station: The patient has a normal gait. Tandem gait is normal. Romberg is negative. No drift is seen.  Reflexes: Deep tendon reflexes are symmetric.   Assessment/Plan:  1.  Anxiety disorder  2.  Seizures, generalized  tonic-clonic  The patient is doing well on his current regimen with clonazepam.  He will continue 1 mg 3 times daily.  He is able to operate a motor vehicle and to maintain gainful employment.  He will follow-up here in 1 year, sooner if needed.  In the future, he can be followed through Dr. Teresa Coombs.  Marlan Palau MD 08/07/2021 3:51 PM  [facility] [address] [place], [place] [address]  Phone [phone] Fax [phone]

## 2021-10-14 MED ORDER — CLONAZEPAM 1 MG PO TABS: ORAL_TABLET | ORAL | 1 refills | Status: DC

## 2021-10-14 NOTE — Telephone Encounter (Signed)
Refill sent to the pharmacy 

## 2022-04-20 ENCOUNTER — Other Ambulatory Visit: Payer: Self-pay | Admitting: Neurology

## 2022-04-21 NOTE — Telephone Encounter (Signed)
Verify Drug Registry For Clonazepam 1 Mg Tablet ?Last Filled: 01/14/2022 ?Quantity: 270 tablets for 90 days ?Last appointment: 08/07/2021 ?Next appointment: 08/07/2022 ?

## 2022-07-20 ENCOUNTER — Other Ambulatory Visit: Payer: Self-pay | Admitting: Neurology

## 2022-07-21 ENCOUNTER — Telehealth: Payer: Self-pay | Admitting: Physician Assistant

## 2022-07-21 ENCOUNTER — Other Ambulatory Visit: Payer: Self-pay | Admitting: Neurology

## 2022-07-21 ENCOUNTER — Encounter: Payer: Self-pay | Admitting: Physician Assistant

## 2022-07-21 DIAGNOSIS — J4540 Moderate persistent asthma, uncomplicated: Secondary | ICD-10-CM

## 2022-07-21 MED ORDER — CLONAZEPAM 1 MG PO TABS: ORAL_TABLET | ORAL | 0 refills | Status: DC

## 2022-07-21 MED ORDER — ALBUTEROL SULFATE HFA 108 (90 BASE) MCG/ACT IN AERS: INHALATION_SPRAY | RESPIRATORY_TRACT | 0 refills | Status: DC

## 2022-07-21 NOTE — Telephone Encounter (Signed)
[facility] faxed refill request for the following medications:   albuterol (VENTOLIN HFA) 108 (90 Base) MCG/ACT inhaler   Please advise.  

## 2022-07-21 NOTE — Addendum Note (Signed)
Addended by: Lily Kocher on: 07/21/2022 11:22 AM   Modules accepted: Orders

## 2022-08-04 ENCOUNTER — Other Ambulatory Visit: Payer: Self-pay | Admitting: Family Medicine

## 2022-08-04 DIAGNOSIS — F419 Anxiety disorder, unspecified: Secondary | ICD-10-CM

## 2022-08-06 NOTE — Progress Notes (Unsigned)
Patient: Jay Francis Date of Birth: August 28, 1986  Reason for Visit: Follow up History from: Patient Primary Neurologist: Dr. Teresa Coombs  ASSESSMENT AND PLAN 36 y.o. year old male   1.  Seizures, generalized 2.  Anxiety disorder -last seizure was in 2017, 1st was in 2013 -we discussed he could benefit from seeing a psychiatrist for his mood management, I placed the referral  -will continue Klonopin 1 mg 3 times daily for now, will see Dr. Teresa Coombs in 2-3 months to discuss seizure medication going forward, jut filled in August for 3 months -per chart normal EEG in the past -I can see him in 6 months   HISTORY OF PRESENT ILLNESS: Today 08/07/22 Jay Francis is here today for follow-up. Remains on Klonopin 1 mg 3 times daily. Has never been on any other seizure medications. 1st generalized seizure was in 2013, bit the sides of his tongue. Afterwards was placed on klonopin, which helped his panic disorder/anxiety/seizures. Last seizure was in 2017, when he missed 2 doses of klonopin. His anxiety is under fair control, takes klonopin 7 am, 3 pm, 10 pm. Also on Lexapro. Doesn't see psychiatry. Records indicate in the past EEG was normal. Gainfully employed, works full time. No gait instability. Denies side effects from klonopin, only positive effects. He avoids driving. Sometimes has vertigo spell with position change or turning head, seems more during anxiety.   HISTORY  08/07/21 Dr. Anne Hahn: Mr. Manuele is a 36 year old right-handed white male with a history of an anxiety disorder and panic disorder and a history of seizures.  He has had 2 generalized tonic-clonic seizures lifetime, the last one was around 2017.  On clonazepam, he has been able to function much better and has had relatively good control of his seizures.  The patient takes 1 mg 3 times daily, he does not have any drowsiness or gait instability on the medication.  He sleeps well at night.  He still has occasional panic attacks, but he has  learned how to control them better.  He continues to work and functions well in this regard.  He works 3 days at home and 2 days in the office which helps alleviate some of the stress.  He has been engaged in regular exercise.  He has lost about 20 pounds of weight.  Overall, he feels much better.  He returns for an evaluation.  REVIEW OF SYSTEMS: Out of a complete 14 system review of symptoms, the patient complains only of the following symptoms, and all other reviewed systems are negative.  See HPI  ALLERGIES: Allergies  Allergen Reactions   Amoxicillin Diarrhea and Nausea Only    HOME MEDICATIONS: Outpatient Medications Prior to Visit  Medication Sig Dispense Refill   albuterol (VENTOLIN HFA) 108 (90 Base) MCG/ACT inhaler INHALE 2 PUFFS BY MOUTH EVERY 6 HOURS AS NEEDED FOR WHEEZING OR SHORTNESS OF BREATH 6.7 g 0   clonazePAM (KLONOPIN) 1 MG tablet 1 Tab TID 270 tablet 0   escitalopram (LEXAPRO) 10 MG tablet Take 1 tablet (10 mg total) by mouth daily. 60 tablet 0   ibuprofen (ADVIL,MOTRIN) 600 MG tablet Take 1 tablet (600 mg total) by mouth every 6 (six) hours as needed. 30 tablet 0   loratadine (CLARITIN) 10 MG tablet Take 10 mg by mouth daily.     Probiotic Product (PRO-BIOTIC BLEND PO) Take 1 tablet by mouth daily.     No facility-administered medications prior to visit.    PAST MEDICAL HISTORY: Past Medical History:  Diagnosis  Date   Panic disorder without agoraphobia    Seizures (HCC)     PAST SURGICAL HISTORY: Past Surgical History:  Procedure Laterality Date   None     WISDOM TOOTH EXTRACTION      FAMILY HISTORY: Family History  Problem Relation Age of Onset   Healthy Mother    Allergies Mother    Melanoma Mother    Healthy Father    Melanoma Father    Healthy Brother    Seizures Maternal Grandfather     SOCIAL HISTORY: Social History   Socioeconomic History   Marital status: Married    Spouse name: Not on file   Number of children: 0   Years of  education: Associates   Highest education level: Not on file  Occupational History   Occupation: LOGISTICS    Employer: [employer]  Tobacco Use   Smoking status: Never   Smokeless tobacco: Never  Vaping Use   Vaping Use: Never used  Substance and Sexual Activity   Alcohol use: No   Drug use: No   Sexual activity: Yes    Partners: Female  Other Topics Concern   Not on file  Social History Narrative   Patient is single.    Patient has no children.    Patient is full-time.    Patient has an Associates degree.   Patient is right handed.    Social Determinants of Health   Financial Resource Strain: Not on file  Food Insecurity: Not on file  Transportation Needs: Not on file  Physical Activity: Not on file  Stress: Not on file  Social Connections: Not on file  Intimate Partner Violence: Not on file    PHYSICAL EXAM  Vitals:   08/07/22 1447  BP: 136/84  Pulse: 74  Weight: 168 lb (76.2 kg)  Height: 5\' 9"  (1.753 m)   Body mass index is 24.81 kg/m.  Generalized: Well developed, in no acute distress, relaxed, calm Neurological examination  Mentation: Alert oriented to time, place, history taking. Follows all commands speech and language fluent Cranial nerve II-XII: Pupils were equal round reactive to light. Extraocular movements were full, visual field were full on confrontational test. Facial sensation and strength were normal. Head turning and shoulder shrug  were normal and symmetric. Motor: The motor testing reveals 5 over 5 strength of all 4 extremities. Good symmetric motor tone is noted throughout.  Sensory: Sensory testing is intact to soft touch on all 4 extremities. No evidence of extinction is noted.  Coordination: Cerebellar testing reveals good finger-nose-finger and heel-to-shin bilaterally.  Gait and station: Gait is normal. Tandem gait is normal. Romberg is negative. No drift is seen.  Reflexes: Deep tendon reflexes are symmetric and normal  bilaterally.   DIAGNOSTIC DATA (LABS, IMAGING, TESTING) - I reviewed patient records, labs, notes, testing and imaging myself where available.  Lab Results  Component Value Date   WBC 6.2 02/20/2021   HGB 14.6 02/20/2021   HCT 42.8 02/20/2021   MCV 86 02/20/2021   PLT 251 02/20/2021      Component Value Date/Time   NA 140 02/20/2021 0944   K 4.8 02/20/2021 0944   CL 102 02/20/2021 0944   CO2 22 02/20/2021 0944   GLUCOSE 94 02/20/2021 0944   GLUCOSE 130 (H) 11/19/2017 0858   BUN 11 02/20/2021 0944   CREATININE 1.11 02/20/2021 0944   CALCIUM 10.4 (H) 02/20/2021 0944   PROT 7.6 02/20/2021 0944   ALBUMIN 5.3 (H) 02/20/2021 0944  AST 21 02/20/2021 0944   ALT 28 02/20/2021 0944   ALKPHOS 45 02/20/2021 0944   BILITOT 0.5 02/20/2021 0944   GFRNONAA 103 05/03/2019 0909   GFRAA 120 05/03/2019 0909   Lab Results  Component Value Date   CHOL 233 (H) 02/20/2021   HDL 49 02/20/2021   LDLCALC 172 (H) 02/20/2021   TRIG 69 02/20/2021   CHOLHDL 4.8 02/20/2021   No results found for: "HGBA1C" No results found for: "VITAMINB12" Lab Results  Component Value Date   TSH 2.110 02/20/2021   Butler Denmark, AGNP-C, DNP 08/07/2022, 3:26 PM [facility] [address], [place], [place] [address] [phone]

## 2022-08-07 ENCOUNTER — Ambulatory Visit (INDEPENDENT_AMBULATORY_CARE_PROVIDER_SITE_OTHER): Payer: BC Managed Care – PPO | Admitting: Neurology

## 2022-08-07 ENCOUNTER — Encounter: Payer: Self-pay | Admitting: Physician Assistant

## 2022-08-07 ENCOUNTER — Encounter: Payer: Self-pay | Admitting: Neurology

## 2022-08-07 VITALS — BP 136/84 | HR 74 | Ht 69.0 in | Wt 168.0 lb

## 2022-08-07 DIAGNOSIS — F32A Depression, unspecified: Secondary | ICD-10-CM

## 2022-08-07 DIAGNOSIS — G40309 Generalized idiopathic epilepsy and epileptic syndromes, not intractable, without status epilepticus: Secondary | ICD-10-CM

## 2022-08-07 DIAGNOSIS — F419 Anxiety disorder, unspecified: Secondary | ICD-10-CM | POA: Diagnosis not present

## 2022-08-07 MED ORDER — ESCITALOPRAM OXALATE 10 MG PO TABS: 10.0000 mg | ORAL_TABLET | Freq: Every day | ORAL | 0 refills | Status: DC

## 2022-08-07 NOTE — Patient Instructions (Signed)
I will refer you to psychiatry  Keep current medications Referral to psych  See you back in 6 months

## 2022-08-13 ENCOUNTER — Encounter: Payer: Self-pay | Admitting: Neurology

## 2022-08-13 ENCOUNTER — Telehealth: Payer: Self-pay | Admitting: Neurology

## 2022-08-13 NOTE — Telephone Encounter (Signed)
Sent to [facility] ph # [phone].

## 2022-08-14 NOTE — Telephone Encounter (Signed)
Reviewed referral Type Date User Summary Attachment  Provider Comments 08/07/2022  3:28 PM Glean Salvo, NP Provider Comments -  Note:   Please send to psychiatry office accepting new patients

## 2022-08-21 NOTE — Telephone Encounter (Signed)
He has an appointment 09/03/22 at 3pm with Kerin Salen, PA.

## 2022-09-03 DIAGNOSIS — F410 Panic disorder [episodic paroxysmal anxiety] without agoraphobia: Secondary | ICD-10-CM | POA: Diagnosis not present

## 2022-09-03 DIAGNOSIS — F3181 Bipolar II disorder: Secondary | ICD-10-CM | POA: Diagnosis not present

## 2022-09-03 DIAGNOSIS — F419 Anxiety disorder, unspecified: Secondary | ICD-10-CM | POA: Diagnosis not present

## 2022-09-17 ENCOUNTER — Encounter: Payer: Self-pay | Admitting: Neurology

## 2022-10-02 ENCOUNTER — Ambulatory Visit (INDEPENDENT_AMBULATORY_CARE_PROVIDER_SITE_OTHER): Payer: BC Managed Care – PPO | Admitting: Physician Assistant

## 2022-10-02 ENCOUNTER — Encounter: Payer: Self-pay | Admitting: Physician Assistant

## 2022-10-02 VITALS — BP 114/75 | HR 71 | Ht 68.0 in | Wt 169.0 lb

## 2022-10-02 DIAGNOSIS — F319 Bipolar disorder, unspecified: Secondary | ICD-10-CM | POA: Diagnosis not present

## 2022-10-02 DIAGNOSIS — E785 Hyperlipidemia, unspecified: Secondary | ICD-10-CM

## 2022-10-02 DIAGNOSIS — R569 Unspecified convulsions: Secondary | ICD-10-CM

## 2022-10-02 DIAGNOSIS — Z0000 Encounter for general adult medical examination without abnormal findings: Secondary | ICD-10-CM

## 2022-10-02 DIAGNOSIS — J4540 Moderate persistent asthma, uncomplicated: Secondary | ICD-10-CM | POA: Diagnosis not present

## 2022-10-02 NOTE — Progress Notes (Signed)
Complete physical exam   Patient: Jay Francis   DOB: 05/16/1986   36 y.o. Male  MRN: [id number] Visit Date: 10/02/2022  Today's healthcare provider: Alfredia Ferguson, PA-C  Cc. cpe  Subjective    Jay Francis is a 36 y.o. male who presents today for a complete physical exam.  He reports consuming a general and 500 calories at night  diet.  The patient reports walking 3 miles daily.  He generally feels fairly well. He reports sleeping fairly well. He does not have additional problems to discuss today.  HPI  Pt reports update in history-- recent diagnosis of bipolar disorder working w/ psychiatry. Stable seizure history, no new seizures.  Past Medical History:  Diagnosis Date   Panic disorder without agoraphobia    Seizures (HCC)    Past Surgical History:  Procedure Laterality Date   None     WISDOM TOOTH EXTRACTION     Social History   Socioeconomic History   Marital status: Married    Spouse name: Not on file   Number of children: 0   Years of education: Associates   Highest education level: Not on file  Occupational History   Occupation: LOGISTICS    Employer: [employer]  Tobacco Use   Smoking status: Never   Smokeless tobacco: Never  Vaping Use   Vaping Use: Never used  Substance and Sexual Activity   Alcohol use: No   Drug use: No   Sexual activity: Yes    Partners: Female  Other Topics Concern   Not on file  Social History Narrative   Patient is single.    Patient has no children.    Patient is full-time.    Patient has an Associates degree.   Patient is right handed.    Social Determinants of Health   Financial Resource Strain: Not on file  Food Insecurity: Not on file  Transportation Needs: Not on file  Physical Activity: Not on file  Stress: Not on file  Social Connections: Not on file  Intimate Partner Violence: Not on file   Family Status  Relation Name Status   Mother  Alive   Father  Alive   Brother  Alive   Mat Uncle   (Not Specified)   MGM  Deceased   MGF  Alive   PGM  Deceased   PGF  Alive   Family History  Problem Relation Age of Onset   Healthy Mother    Allergies Mother    Melanoma Mother    Healthy Father    Melanoma Father    Bipolar disorder Brother        childhood   Bipolar disorder Maternal Uncle 30 - 39   Seizures Maternal Grandfather    Allergies  Allergen Reactions   Amoxicillin Diarrhea and Nausea Only    Patient Care Team: Alfredia Ferguson, PA-C as PCP - General (Physician Assistant) Trey Sailors, PA-C (Inactive) as Physician Assistant (Physician Assistant) Windell Norfolk, MD as Consulting Physician (Neurology) Glean Salvo, NP as Nurse Practitioner (Neurology)   Medications: Outpatient Medications Prior to Visit  Medication Sig   albuterol (VENTOLIN HFA) 108 (90 Base) MCG/ACT inhaler INHALE 2 PUFFS BY MOUTH EVERY 6 HOURS AS NEEDED FOR WHEEZING OR SHORTNESS OF BREATH   clonazePAM (KLONOPIN) 1 MG tablet 1 Tab TID   escitalopram (LEXAPRO) 10 MG tablet Take 1 tablet (10 mg total) by mouth daily.   ibuprofen (ADVIL,MOTRIN) 600 MG tablet Take 1 tablet (600 mg total)  by mouth every 6 (six) hours as needed.   loratadine (CLARITIN) 10 MG tablet Take 10 mg by mouth daily.   lurasidone (LATUDA) 20 MG TABS tablet SMARTSIG:1 Tablet(s) By Mouth Every Evening   Probiotic Product (PRO-BIOTIC BLEND PO) Take 1 tablet by mouth daily.   No facility-administered medications prior to visit.    Review of Systems  Genitourinary:  Positive for frequency.  Neurological:  Positive for seizures.  Psychiatric/Behavioral:  Positive for agitation, behavioral problems, decreased concentration, dysphoric mood and sleep disturbance. The patient is nervous/anxious.       Blood pressure 114/75, pulse 71, height 5\' 8"  (1.727 m), weight 169 lb (76.7 kg), SpO2 100 %.    Physical Exam Constitutional:      General: He is awake.     Appearance: He is well-developed.  HENT:     Head:  Normocephalic.     Right Ear: Tympanic membrane, ear canal and external ear normal.     Left Ear: Tympanic membrane, ear canal and external ear normal.     Nose: Nose normal. No congestion or rhinorrhea.     Mouth/Throat:     Mouth: Mucous membranes are moist.     Pharynx: No oropharyngeal exudate or posterior oropharyngeal erythema.  Eyes:     Pupils: Pupils are equal, round, and reactive to light.  Cardiovascular:     Rate and Rhythm: Normal rate and regular rhythm.     Heart sounds: Normal heart sounds.  Pulmonary:     Effort: Pulmonary effort is normal.     Breath sounds: Normal breath sounds.  Abdominal:     General: There is no distension.     Palpations: Abdomen is soft.     Tenderness: There is no abdominal tenderness. There is no guarding.  Musculoskeletal:     Cervical back: Normal range of motion.     Right lower leg: No edema.     Left lower leg: No edema.  Lymphadenopathy:     Cervical: No cervical adenopathy.  Skin:    General: Skin is warm.  Neurological:     Mental Status: He is alert and oriented to person, place, and time.  Psychiatric:        Attention and Perception: Attention normal.        Mood and Affect: Mood normal.        Speech: Speech normal.        Behavior: Behavior normal. Behavior is cooperative.     Last depression screening scores    10/02/2022    2:25 PM 02/20/2021    9:11 AM 08/21/2020    3:41 PM  PHQ 2/9 Scores  PHQ - 2 Score 2 2 2   PHQ- 9 Score 5 3 2    Last fall risk screening    10/02/2022    2:25 PM  Fall Risk   Falls in the past year? 0  Number falls in past yr: 0  Injury with Fall? 0  Risk for fall due to : No Fall Risks  Follow up Falls evaluation completed   Last Audit-C alcohol use screening    10/02/2022    2:25 PM  Alcohol Use Disorder Test (AUDIT)  1. How often do you have a drink containing alcohol? 0  2. How many drinks containing alcohol do you have on a typical day when you are drinking? 0  3. How often  do you have six or more drinks on one occasion? 0  AUDIT-C Score 0   A score  of 3 or more in women, and 4 or more in men indicates increased risk for alcohol abuse, EXCEPT if all of the points are from question 1   No results found for any visits on 10/02/22.  Assessment & Plan    Routine Health Maintenance and Physical Exam  Exercise Activities and Dietary recommendations --balanced diet high in fiber and protein, low in sugars, carbs, fats. --physical activity/exercise 30 minutes 3-5 times a week    Immunization History  Administered Date(s) Administered   Tdap 06/18/2018    Health Maintenance  Topic Date Due   COVID-19 Vaccine (1) Never done   INFLUENZA VACCINE  03/15/2023 (Originally 07/15/2022)   TETANUS/TDAP  06/18/2028   Hepatitis C Screening  Completed   HIV Screening  Completed   HPV VACCINES  Aged Out    Discussed health benefits of physical activity, and encouraged him to engage in regular exercise appropriate for his age and condition.  Problem List Items Addressed This Visit       Respiratory   Asthma    Pt reports inhaler use is 0-3 times a week during allergy season Advised if he is consistently using 3+ times a week we should discuss additional inhaler        Other   Seizures (HCC)    Stable, f/b neurology      Hyperlipidemia    Will repeat lipids /cmp today Historically elevated but major life changes since last checked.       Relevant Orders   Comprehensive Metabolic Panel (CMET)   Lipid Profile   Bipolar 1 disorder ([place])    F/b psychiatry.  Pt doing well currently.      Relevant Orders   TSH   Other Visit Diagnoses     Annual physical exam    -  Primary   Relevant Orders   CBC w/Diff/Platelet   HgB A1c        Return in about 1 year (around 10/03/2023) for CPE.     I, Mikey Kirschner, PA-C have reviewed all documentation for this visit. The documentation on  10/02/2022 for the exam, diagnosis, procedures, and orders are all  accurate and complete.  Mikey Kirschner, PA-C [facility] [address] [place], [place], [address] Office: [phone] Fax: [place]

## 2022-10-02 NOTE — Assessment & Plan Note (Signed)
Stable, f/b neurology

## 2022-10-02 NOTE — Assessment & Plan Note (Signed)
Will repeat lipids /cmp today Historically elevated but major life changes since last checked.

## 2022-10-02 NOTE — Assessment & Plan Note (Signed)
F/b psychiatry.  Pt doing well currently.

## 2022-10-02 NOTE — Assessment & Plan Note (Signed)
Pt reports inhaler use is 0-3 times a week during allergy season Advised if he is consistently using 3+ times a week we should discuss additional inhaler

## 2022-10-03 DIAGNOSIS — F410 Panic disorder [episodic paroxysmal anxiety] without agoraphobia: Secondary | ICD-10-CM | POA: Diagnosis not present

## 2022-10-03 DIAGNOSIS — F3181 Bipolar II disorder: Secondary | ICD-10-CM | POA: Diagnosis not present

## 2022-10-03 DIAGNOSIS — F419 Anxiety disorder, unspecified: Secondary | ICD-10-CM | POA: Diagnosis not present

## 2022-10-03 LAB — LIPID PANEL
Chol/HDL Ratio: 4.4 ratio (ref 0.0–5.0)
Cholesterol, Total: 204 mg/dL — ABNORMAL HIGH (ref 100–199)
HDL: 46 mg/dL (ref >39)
LDL Chol Calc (NIH): 141 mg/dL — ABNORMAL HIGH (ref 0–99)
Triglycerides: 92 mg/dL (ref 0–149)
VLDL Cholesterol Cal: 17 mg/dL (ref 5–40)

## 2022-10-03 LAB — CBC WITH DIFFERENTIAL/PLATELET
Basophils Absolute: 0 10*3/uL (ref 0.0–0.2)
Basos: 1 %
EOS (ABSOLUTE): 0 10*3/uL (ref 0.0–0.4)
Eos: 0 %
Hematocrit: 40.5 % (ref 37.5–51.0)
Hemoglobin: 14.1 g/dL (ref 13.0–17.7)
Immature Grans (Abs): 0 10*3/uL (ref 0.0–0.1)
Immature Granulocytes: 0 %
Lymphocytes Absolute: 2.1 10*3/uL (ref 0.7–3.1)
Lymphs: 30 %
MCH: 29.7 pg (ref 26.6–33.0)
MCHC: 34.8 g/dL (ref 31.5–35.7)
MCV: 85 fL (ref 79–97)
Monocytes Absolute: 0.7 10*3/uL (ref 0.1–0.9)
Monocytes: 10 %
Neutrophils Absolute: 4.2 10*3/uL (ref 1.4–7.0)
Neutrophils: 59 %
Platelets: 255 10*3/uL (ref 150–450)
RBC: 4.75 x10E6/uL (ref 4.14–5.80)
RDW: 11.8 % (ref 11.6–15.4)
WBC: 7.1 10*3/uL (ref 3.4–10.8)

## 2022-10-03 LAB — COMPREHENSIVE METABOLIC PANEL
ALT: 28 IU/L (ref 0–44)
AST: 17 IU/L (ref 0–40)
Albumin/Globulin Ratio: 2.7 — ABNORMAL HIGH (ref 1.2–2.2)
Albumin: 5.1 g/dL (ref 4.1–5.1)
Alkaline Phosphatase: 45 IU/L (ref 44–121)
BUN/Creatinine Ratio: 11 (ref 9–20)
BUN: 12 mg/dL (ref 6–20)
Bilirubin Total: 0.4 mg/dL (ref 0.0–1.2)
CO2: 24 mmol/L (ref 20–29)
Calcium: 9.7 mg/dL (ref 8.7–10.2)
Chloride: 102 mmol/L (ref 96–106)
Creatinine, Ser: 1.05 mg/dL (ref 0.76–1.27)
Globulin, Total: 1.9 g/dL (ref 1.5–4.5)
Glucose: 86 mg/dL (ref 70–99)
Potassium: 4.3 mmol/L (ref 3.5–5.2)
Sodium: 140 mmol/L (ref 134–144)
Total Protein: 7 g/dL (ref 6.0–8.5)
eGFR: 94 mL/min/{1.73_m2} (ref >59)

## 2022-10-03 LAB — HEMOGLOBIN A1C
Est. average glucose Bld gHb Est-mCnc: 105 mg/dL
Hgb A1c MFr Bld: 5.3 % (ref 4.8–5.6)

## 2022-10-03 LAB — TSH: TSH: 1.21 u[IU]/mL (ref 0.450–4.500)

## 2022-10-17 ENCOUNTER — Other Ambulatory Visit: Payer: Self-pay | Admitting: Neurology

## 2022-10-20 ENCOUNTER — Other Ambulatory Visit: Payer: Self-pay | Admitting: Neurology

## 2022-10-21 NOTE — Telephone Encounter (Signed)
Patient is due for a refill on clonazepam. Patient is up to date on her appointments. [place] Controlled Substance Registry checked and is appropriate.

## 2022-10-21 NOTE — Telephone Encounter (Signed)
Will decrease the Klonopin from TID to BID and only give patient 60 days supply

## 2022-10-31 DIAGNOSIS — F3181 Bipolar II disorder: Secondary | ICD-10-CM | POA: Diagnosis not present

## 2022-10-31 DIAGNOSIS — F410 Panic disorder [episodic paroxysmal anxiety] without agoraphobia: Secondary | ICD-10-CM | POA: Diagnosis not present

## 2022-10-31 DIAGNOSIS — F419 Anxiety disorder, unspecified: Secondary | ICD-10-CM | POA: Diagnosis not present

## 2022-11-24 ENCOUNTER — Ambulatory Visit: Payer: BC Managed Care – PPO | Admitting: Neurology

## 2022-11-24 ENCOUNTER — Encounter: Payer: Self-pay | Admitting: Neurology

## 2022-11-24 VITALS — BP 133/90 | HR 70 | Ht 68.0 in | Wt 182.0 lb

## 2022-11-24 DIAGNOSIS — G40309 Generalized idiopathic epilepsy and epileptic syndromes, not intractable, without status epilepticus: Secondary | ICD-10-CM

## 2022-11-24 DIAGNOSIS — F419 Anxiety disorder, unspecified: Secondary | ICD-10-CM

## 2022-11-24 DIAGNOSIS — F32A Depression, unspecified: Secondary | ICD-10-CM | POA: Diagnosis not present

## 2022-11-24 DIAGNOSIS — F3181 Bipolar II disorder: Secondary | ICD-10-CM | POA: Diagnosis not present

## 2022-11-24 NOTE — Patient Instructions (Signed)
Continue with current meds.

## 2022-11-24 NOTE — Progress Notes (Signed)
Patient: Jay Francis Date of Birth: 1986/09/10  Reason for Visit: Follow up History from: Patient Primary Neurologist: Dr. Teresa Francis  ASSESSMENT AND PLAN 36 y.o. year old male   1.  Seizures, generalized 2.  Anxiety disorder -last seizure was in 2017, 1st was in 2013 -Continue to follow up with psychiatry.  Since he has not had any seizures since 2017, I informed patient that psychiatrist will continue to manage his mood with Depakote, she can also add lamotrigine if needed. -Will continue Klonopin 1 mg 3 times daily for now, but plan will be to decrease and discontinue in the future.  -Follow up in a year   HISTORY OF PRESENT ILLNESS: Today 11/24/22 Jay Francis presents today for follow-up.  Last visit was in August at that time plan was for patient to set up care with a psychiatrist.  He did and he is glad that he did because he was diagnosed with bipolar type II and started on medications.  He is on Latuda 40 mg nightly, Depakote 500 mg twice daily.  His Lexapro was discontinued.  The patient reports that he is doing well on those 2 medications.  He is still on the Klonopin 1 mg 3 times daily. He reports that he accept his diagnosis of bipolar disorder, that his brother also has bipolar and he will be working with a psychiatrist to further manage his symptoms.  He denies any seizure or seizure-like activity.   Interval History 08/07/2022 SS: Patient is here today for follow-up. Remains on Klonopin 1 mg 3 times daily. Has never been on any other seizure medications. 1st generalized seizure was in 2013, bit the sides of his tongue. Afterwards was placed on klonopin, which helped his panic disorder/anxiety/seizures. Last seizure was in 2017, when he missed 2 doses of klonopin. His anxiety is under fair control, takes klonopin 7 am, 3 pm, 10 pm. Also on Lexapro. Doesn't see psychiatry. Records indicate in the past EEG was normal. Gainfully employed, works full time. No gait instability.  Denies side effects from klonopin, only positive effects. He avoids driving. Sometimes has vertigo spell with position change or turning head, seems more during anxiety.   HISTORY  08/07/21 Dr. Anne Francis: Jay Francis is a 36 year old right-handed white male with a history of an anxiety disorder and panic disorder and a history of seizures.  He has had 2 generalized tonic-clonic seizures lifetime, the last one was around 2017.  On clonazepam, he has been able to function much better and has had relatively good control of his seizures.  The patient takes 1 mg 3 times daily, he does not have any drowsiness or gait instability on the medication.  He sleeps well at night.  He still has occasional panic attacks, but he has learned how to control them better.  He continues to work and functions well in this regard.  He works 3 days at home and 2 days in the office which helps alleviate some of the stress.  He has been engaged in regular exercise.  He has lost about 20 pounds of weight.  Overall, he feels much better.  He returns for an evaluation.  REVIEW OF SYSTEMS: Out of a complete 14 system review of symptoms, the patient complains only of the following symptoms, and all other reviewed systems are negative.  See HPI  ALLERGIES: Allergies  Allergen Reactions   Amoxicillin Diarrhea and Nausea Only    HOME MEDICATIONS: Outpatient Medications Prior to Visit  Medication Sig Dispense Refill  albuterol (VENTOLIN HFA) 108 (90 Base) MCG/ACT inhaler INHALE 2 PUFFS BY MOUTH EVERY 6 HOURS AS NEEDED FOR WHEEZING OR SHORTNESS OF BREATH 6.7 g 0   clonazePAM (KLONOPIN) 1 MG tablet Take 1 tablet (1 mg total) by mouth 2 (two) times daily. 120 tablet 0   divalproex (DEPAKOTE) 500 MG DR tablet Take 500 mg by mouth 2 (two) times daily.     ibuprofen (ADVIL,MOTRIN) 600 MG tablet Take 1 tablet (600 mg total) by mouth every 6 (six) hours as needed. 30 tablet 0   loratadine (CLARITIN) 10 MG tablet Take 10 mg by mouth daily.      lurasidone (LATUDA) 40 MG TABS tablet Take 40 mg by mouth at bedtime.     Probiotic Product (PRO-BIOTIC BLEND PO) Take 1 tablet by mouth daily.     escitalopram (LEXAPRO) 10 MG tablet Take 1 tablet (10 mg total) by mouth daily. 60 tablet 0   lurasidone (LATUDA) 20 MG TABS tablet SMARTSIG:1 Tablet(s) By Mouth Every Evening     No facility-administered medications prior to visit.    PAST MEDICAL HISTORY: Past Medical History:  Diagnosis Date   Panic disorder without agoraphobia    Seizures (HCC)     PAST SURGICAL HISTORY: Past Surgical History:  Procedure Laterality Date   None     WISDOM TOOTH EXTRACTION      FAMILY HISTORY: Family History  Problem Relation Age of Onset   Healthy Mother    Allergies Mother    Melanoma Mother    Healthy Father    Melanoma Father    Bipolar disorder Brother        childhood   Bipolar disorder Maternal Uncle 62 - 53   Seizures Maternal Grandfather     SOCIAL HISTORY: Social History   Socioeconomic History   Marital status: Married    Spouse name: Not on file   Number of children: 0   Years of education: Associates   Highest education level: Not on file  Occupational History   Occupation: Quarry manager: [employer]  Tobacco Use   Smoking status: Never   Smokeless tobacco: Never  Vaping Use   Vaping Use: Never used  Substance and Sexual Activity   Alcohol use: No   Drug use: No   Sexual activity: Yes    Partners: Female  Other Topics Concern   Not on file  Social History Narrative   Patient is single.    Patient has no children.    Patient is full-time.    Patient has an Associates degree.   Patient is right handed.    Social Determinants of Health   Financial Resource Strain: Not on file  Food Insecurity: Not on file  Transportation Needs: Not on file  Physical Activity: Not on file  Stress: Not on file  Social Connections: Not on file  Intimate Partner Violence: Not on file    PHYSICAL  EXAM  Vitals:   11/24/22 1430  BP: (!) 133/90  Pulse: 70  Weight: 182 lb (82.6 kg)  Height: 5\' 8"  (1.727 m)   Body mass index is 27.67 kg/m.  Generalized: Well developed, in no acute distress, relaxed, calm Neurological examination  Mentation: Alert oriented to time, place, history taking. Follows all commands speech and language fluent Cranial nerve II-XII: Pupils were equal round reactive to light. Extraocular movements were full, visual field were full on confrontational test. Facial sensation and strength were normal. Head turning and shoulder shrug  were  normal and symmetric. Motor: The motor testing reveals 5 over 5 strength of all 4 extremities. Good symmetric motor tone is noted throughout.  Sensory: Sensory testing is intact to soft touch on all 4 extremities. No evidence of extinction is noted.  Coordination: Cerebellar testing reveals good finger-nose-finger and heel-to-shin bilaterally.  Gait and station: Gait is normal. Tandem gait is normal. Romberg is negative. No drift is seen.  Reflexes: Deep tendon reflexes are symmetric and normal bilaterally.   DIAGNOSTIC DATA (LABS, IMAGING, TESTING) - I reviewed patient records, labs, notes, testing and imaging myself where available.  Lab Results  Component Value Date   WBC 7.1 10/02/2022   HGB 14.1 10/02/2022   HCT 40.5 10/02/2022   MCV 85 10/02/2022   PLT 255 10/02/2022      Component Value Date/Time   NA 140 10/02/2022 1521   K 4.3 10/02/2022 1521   CL 102 10/02/2022 1521   CO2 24 10/02/2022 1521   GLUCOSE 86 10/02/2022 1521   GLUCOSE 130 (H) 11/19/2017 0858   BUN 12 10/02/2022 1521   CREATININE 1.05 10/02/2022 1521   CALCIUM 9.7 10/02/2022 1521   PROT 7.0 10/02/2022 1521   ALBUMIN 5.1 10/02/2022 1521   AST 17 10/02/2022 1521   ALT 28 10/02/2022 1521   ALKPHOS 45 10/02/2022 1521   BILITOT 0.4 10/02/2022 1521   GFRNONAA 103 05/03/2019 0909   GFRAA 120 05/03/2019 0909   Lab Results  Component Value Date    CHOL 204 (H) 10/02/2022   HDL 46 10/02/2022   LDLCALC 141 (H) 10/02/2022   TRIG 92 10/02/2022   CHOLHDL 4.4 10/02/2022   Lab Results  Component Value Date   HGBA1C 5.3 10/02/2022   No results found for: "VITAMINB12" Lab Results  Component Value Date   TSH 1.210 10/02/2022    I have spent a total of 35 minutes dedicated to this patient today, preparing to see patient, performing a medically appropriate examination and evaluation, ordering tests and/or medications and procedures, and counseling and educating the patient/family/caregiver; independently interpreting result and communicating results to the family/patient/caregiver; and documenting clinical information in the electronic medical record.   Windell Norfolk, MD  11/24/2022, 2:43 PM [facility] [address], [address] [place], [place] [address] [phone]

## 2022-11-25 ENCOUNTER — Encounter: Payer: Self-pay | Admitting: Neurology

## 2022-11-30 ENCOUNTER — Other Ambulatory Visit: Payer: Self-pay | Admitting: Neurology

## 2022-12-01 ENCOUNTER — Other Ambulatory Visit: Payer: Self-pay | Admitting: Neurology

## 2022-12-02 DIAGNOSIS — F3181 Bipolar II disorder: Secondary | ICD-10-CM | POA: Diagnosis not present

## 2022-12-02 DIAGNOSIS — F410 Panic disorder [episodic paroxysmal anxiety] without agoraphobia: Secondary | ICD-10-CM | POA: Diagnosis not present

## 2022-12-02 DIAGNOSIS — F419 Anxiety disorder, unspecified: Secondary | ICD-10-CM | POA: Diagnosis not present

## 2022-12-03 ENCOUNTER — Telehealth: Payer: Self-pay

## 2022-12-03 ENCOUNTER — Encounter: Payer: Self-pay | Admitting: Neurology

## 2022-12-03 NOTE — Telephone Encounter (Signed)
Error

## 2022-12-04 MED ORDER — CLONAZEPAM 1 MG PO TABS: 1.0000 mg | ORAL_TABLET | Freq: Three times a day (TID) | ORAL | 0 refills | Status: DC

## 2022-12-04 NOTE — Telephone Encounter (Signed)
I told him we will continue TID for new but at next visit, we will start weaning him off as his psychiatrist is treating his bipolar disorder.

## 2022-12-24 DIAGNOSIS — Z79899 Other long term (current) drug therapy: Secondary | ICD-10-CM | POA: Diagnosis not present

## 2023-01-06 DIAGNOSIS — F419 Anxiety disorder, unspecified: Secondary | ICD-10-CM | POA: Diagnosis not present

## 2023-01-06 DIAGNOSIS — F410 Panic disorder [episodic paroxysmal anxiety] without agoraphobia: Secondary | ICD-10-CM | POA: Diagnosis not present

## 2023-01-06 DIAGNOSIS — F3181 Bipolar II disorder: Secondary | ICD-10-CM | POA: Diagnosis not present

## 2023-02-03 DIAGNOSIS — F419 Anxiety disorder, unspecified: Secondary | ICD-10-CM | POA: Diagnosis not present

## 2023-02-03 DIAGNOSIS — F410 Panic disorder [episodic paroxysmal anxiety] without agoraphobia: Secondary | ICD-10-CM | POA: Diagnosis not present

## 2023-02-03 DIAGNOSIS — F3181 Bipolar II disorder: Secondary | ICD-10-CM | POA: Diagnosis not present

## 2023-02-04 ENCOUNTER — Other Ambulatory Visit: Payer: Self-pay | Admitting: Neurology

## 2023-02-06 ENCOUNTER — Other Ambulatory Visit: Payer: Self-pay | Admitting: Neurology

## 2023-02-07 ENCOUNTER — Encounter: Payer: Self-pay | Admitting: Neurology

## 2023-02-09 MED ORDER — CLONAZEPAM 1 MG PO TABS: 1.0000 mg | ORAL_TABLET | Freq: Three times a day (TID) | ORAL | 0 refills | Status: DC

## 2023-02-09 NOTE — Telephone Encounter (Signed)
Will continue TID for now, at next visit, we will reduce the dose. Thanks

## 2023-02-10 ENCOUNTER — Ambulatory Visit: Payer: BC Managed Care – PPO | Admitting: Neurology

## 2023-03-02 DIAGNOSIS — F410 Panic disorder [episodic paroxysmal anxiety] without agoraphobia: Secondary | ICD-10-CM | POA: Diagnosis not present

## 2023-03-02 DIAGNOSIS — F3181 Bipolar II disorder: Secondary | ICD-10-CM | POA: Diagnosis not present

## 2023-03-02 DIAGNOSIS — F419 Anxiety disorder, unspecified: Secondary | ICD-10-CM | POA: Diagnosis not present

## 2023-04-01 DIAGNOSIS — F410 Panic disorder [episodic paroxysmal anxiety] without agoraphobia: Secondary | ICD-10-CM | POA: Diagnosis not present

## 2023-04-01 DIAGNOSIS — F419 Anxiety disorder, unspecified: Secondary | ICD-10-CM | POA: Diagnosis not present

## 2023-04-01 DIAGNOSIS — F3181 Bipolar II disorder: Secondary | ICD-10-CM | POA: Diagnosis not present

## 2023-04-15 ENCOUNTER — Other Ambulatory Visit: Payer: Self-pay | Admitting: Neurology

## 2023-04-15 ENCOUNTER — Other Ambulatory Visit: Payer: Self-pay

## 2023-04-15 MED ORDER — CLONAZEPAM 1 MG PO TABS: 1.0000 mg | ORAL_TABLET | Freq: Two times a day (BID) | ORAL | 1 refills | Status: DC

## 2023-04-23 ENCOUNTER — Encounter: Payer: Self-pay | Admitting: Physician Assistant

## 2023-04-29 DIAGNOSIS — F3181 Bipolar II disorder: Secondary | ICD-10-CM | POA: Diagnosis not present

## 2023-04-29 DIAGNOSIS — F410 Panic disorder [episodic paroxysmal anxiety] without agoraphobia: Secondary | ICD-10-CM | POA: Diagnosis not present

## 2023-04-29 DIAGNOSIS — F419 Anxiety disorder, unspecified: Secondary | ICD-10-CM | POA: Diagnosis not present

## 2023-05-30 ENCOUNTER — Encounter: Payer: Self-pay | Admitting: Neurology

## 2023-06-01 NOTE — Telephone Encounter (Signed)
We had plans to decrease the Klonopin to BID, this was discussed with patient and documented as of December of last year. For now we will keep him at BID dose. I Can give him refill but it is going to be BID.

## 2023-06-02 ENCOUNTER — Other Ambulatory Visit: Payer: Self-pay

## 2023-06-02 MED ORDER — CLONAZEPAM 1 MG PO TABS: 1.0000 mg | ORAL_TABLET | Freq: Two times a day (BID) | ORAL | 1 refills | Status: DC

## 2023-06-02 NOTE — Addendum Note (Signed)
Addended by: Danne Harbor on: 06/02/2023 09:15 AM   Modules accepted: Orders

## 2023-06-10 ENCOUNTER — Encounter: Payer: Self-pay | Admitting: Neurology

## 2023-07-23 ENCOUNTER — Encounter: Payer: Self-pay | Admitting: Neurology

## 2023-07-23 ENCOUNTER — Other Ambulatory Visit: Payer: Self-pay

## 2023-07-23 MED ORDER — CLONAZEPAM 1 MG PO TABS: 1.0000 mg | ORAL_TABLET | Freq: Two times a day (BID) | ORAL | 1 refills | Status: DC

## 2023-07-23 NOTE — Telephone Encounter (Signed)
Requested Prescriptions   Pending Prescriptions Disp Refills   clonazePAM (KLONOPIN) 1 MG tablet 60 tablet 1    Sig: Take 1 tablet (1 mg total) by mouth 2 (two) times daily.   Last seen 11/24/22, next appt scheduled 11/23/23 Dispenses    Dispensed Days Supply Quantity Provider Pharmacy  CLONAZEPAM 1MG  TABLETS 07/01/2023 30 60 each Windell Norfolk, MD [facility]  CLONAZEPAM 1MG  TABLETS 06/02/2023 30 60 each Windell Norfolk, MD [facility]  CLONAZEPAM 1MG  TABLETS 04/15/2023 60 120 each Windell Norfolk, MD [facility]  CLONAZEPAM 1MG  TABLETS 02/09/2023 60 180 each Windell Norfolk, MD [facility]  CLONAZEPAM 1MG  TABLETS 12/04/2022 60 180 each Windell Norfolk, MD [facility]  CLONAZEPAM 1MG  TABLETS 10/21/2022 60 120 each Windell Norfolk, MD [facility].Marland KitchenMarland Kitchen

## 2023-07-30 ENCOUNTER — Telehealth: Payer: Self-pay | Admitting: Neurology

## 2023-07-30 NOTE — Telephone Encounter (Signed)
LVM and sent mychart msg informing pt of need to reschedule 11/23/23 appt - MD out

## 2023-10-05 ENCOUNTER — Ambulatory Visit: Payer: BC Managed Care – PPO | Admitting: Physician Assistant

## 2023-10-28 DIAGNOSIS — F419 Anxiety disorder, unspecified: Secondary | ICD-10-CM | POA: Diagnosis not present

## 2023-10-28 DIAGNOSIS — F3181 Bipolar II disorder: Secondary | ICD-10-CM | POA: Diagnosis not present

## 2023-10-28 DIAGNOSIS — F410 Panic disorder [episodic paroxysmal anxiety] without agoraphobia: Secondary | ICD-10-CM | POA: Diagnosis not present

## 2023-11-07 ENCOUNTER — Encounter: Payer: Self-pay | Admitting: Neurology

## 2023-11-09 ENCOUNTER — Other Ambulatory Visit: Payer: Self-pay | Admitting: Neurology

## 2023-11-09 MED ORDER — CLONAZEPAM 0.5 MG PO TABS: 0.5000 mg | ORAL_TABLET | Freq: Every day | ORAL | 2 refills | Status: DC

## 2023-11-09 NOTE — Telephone Encounter (Signed)
Done. Thanks.

## 2023-11-11 ENCOUNTER — Other Ambulatory Visit: Payer: Self-pay

## 2023-11-23 ENCOUNTER — Ambulatory Visit: Payer: BC Managed Care – PPO | Admitting: Neurology

## 2024-01-11 ENCOUNTER — Ambulatory Visit: Payer: BC Managed Care – PPO | Admitting: Neurology

## 2024-01-26 DIAGNOSIS — F419 Anxiety disorder, unspecified: Secondary | ICD-10-CM | POA: Diagnosis not present

## 2024-01-26 DIAGNOSIS — F3181 Bipolar II disorder: Secondary | ICD-10-CM | POA: Diagnosis not present

## 2024-01-26 DIAGNOSIS — F410 Panic disorder [episodic paroxysmal anxiety] without agoraphobia: Secondary | ICD-10-CM | POA: Diagnosis not present

## 2024-04-25 DIAGNOSIS — F419 Anxiety disorder, unspecified: Secondary | ICD-10-CM | POA: Diagnosis not present

## 2024-04-25 DIAGNOSIS — F410 Panic disorder [episodic paroxysmal anxiety] without agoraphobia: Secondary | ICD-10-CM | POA: Diagnosis not present

## 2024-04-25 DIAGNOSIS — F3181 Bipolar II disorder: Secondary | ICD-10-CM | POA: Diagnosis not present

## 2024-05-26 ENCOUNTER — Ambulatory Visit: Admitting: Family Medicine

## 2024-05-26 ENCOUNTER — Encounter: Payer: Self-pay | Admitting: Family Medicine

## 2024-05-26 VITALS — BP 125/98 | HR 78 | Resp 14 | Ht 69.0 in | Wt 194.9 lb

## 2024-05-26 DIAGNOSIS — Z79899 Other long term (current) drug therapy: Secondary | ICD-10-CM | POA: Diagnosis not present

## 2024-05-26 DIAGNOSIS — F319 Bipolar disorder, unspecified: Secondary | ICD-10-CM

## 2024-05-26 DIAGNOSIS — Z0001 Encounter for general adult medical examination with abnormal findings: Secondary | ICD-10-CM | POA: Diagnosis not present

## 2024-05-26 DIAGNOSIS — J4520 Mild intermittent asthma, uncomplicated: Secondary | ICD-10-CM

## 2024-05-26 DIAGNOSIS — E782 Mixed hyperlipidemia: Secondary | ICD-10-CM

## 2024-05-26 DIAGNOSIS — E785 Hyperlipidemia, unspecified: Secondary | ICD-10-CM | POA: Diagnosis not present

## 2024-05-26 DIAGNOSIS — G40309 Generalized idiopathic epilepsy and epileptic syndromes, not intractable, without status epilepticus: Secondary | ICD-10-CM | POA: Diagnosis not present

## 2024-05-26 DIAGNOSIS — Z13228 Encounter for screening for other metabolic disorders: Secondary | ICD-10-CM | POA: Diagnosis not present

## 2024-05-26 DIAGNOSIS — Z1329 Encounter for screening for other suspected endocrine disorder: Secondary | ICD-10-CM | POA: Diagnosis not present

## 2024-05-26 DIAGNOSIS — Z130 Encounter for screening for diseases of the blood and blood-forming organs and certain disorders involving the immune mechanism: Secondary | ICD-10-CM

## 2024-05-26 DIAGNOSIS — Z0000 Encounter for general adult medical examination without abnormal findings: Secondary | ICD-10-CM

## 2024-05-26 DIAGNOSIS — R030 Elevated blood-pressure reading, without diagnosis of hypertension: Secondary | ICD-10-CM

## 2024-05-26 NOTE — Patient Instructions (Addendum)
 I recommend following up with neurology as they originally recommended he follow-up around December 2024/January 2025. _________________________________________________  Check your blood pressure twice weekly, and any time you have concerning symptoms like headache, chest pain, dizziness, shortness of breath, or vision changes.   Our goal is less than 130/80.  To appropriately check your blood pressure, make sure you do the following:  1) Avoid caffeine, exercise, or tobacco products for 30 minutes before checking. Empty your bladder. 2) Sit with your back supported in a flat-backed chair. Rest your arm on something flat (arm of the chair, table, etc). 3) Sit still with your feet flat on the floor, resting, for at least 5 minutes.  4) Check your blood pressure. Take 1-2 readings.  5) Write down these readings and bring with you to any provider appointments.  Bring your home blood pressure machine with you to a provider's office for accuracy comparison at least once a year.   Make sure you take your blood pressure medications before you come to any office visit, even if you were asked to fast for labs.

## 2024-05-26 NOTE — Progress Notes (Signed)
 Complete physical exam   Patient: Jay Francis   DOB: 1986/09/11   38 y.o. Male  MRN: [id number] Visit Date: 05/26/2024  Today's healthcare provider: Carlean Charter, DO   Chief Complaint  Patient presents with   Annual Exam    Sleeping good, exercising daily. No concerns. Decline vaccines   Subjective    Jay Francis is a 38 y.o. male who presents today for a complete physical exam.  He reports consuming a low-to-no carb diet. He exercises daily. He generally feels well. He reports sleeping well. He does not have additional problems to discuss today.   HPI HPI     Annual Exam    Additional comments: Sleeping good, exercising daily. No concerns. Decline vaccines      Last edited by Selinda Dales, CMA on 05/26/2024 10:53 AM.       Jay Francis is a 38 year old male with bipolar disorder who presents for a routine follow-up.  He is currently experiencing optimal mental health, which he attributes to his medication regimen. He has a history of bipolar disorder and is under the care of a psychiatrist, whom he sees every three to four months. His current medications include fluoxetine, gabapentin, lamotrigine, lurasidone, and a probiotic. His brother also has bipolar disorder and is on a similar medication regimen.  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 He has been seizure-free for the past eight years and has successfully tapered off clonazepam , which was challenging. He was under the impression he no longer required neurology follow-up.  He has a history of bronchitis and asthma, previously requiring albuterol , but has not needed the inhaler for at least two years and feels he has outgrown the asthma. He also reports no longer needing Claritin as he feels he has outgrown his allergies.  He uses glasses primarily for work due to light sensitivity but does not experience blurred vision.  He  follows a low-carbohydrate diet, avoiding rice, noodles, and bread, and typically consumes meals like steak, salad, and steamed vegetables. His wife prepares most of his meals and follows a similar diet.    Past Medical History:  Diagnosis Date   Panic disorder without agoraphobia    Seizures (HCC)    Past Surgical History:  Procedure Laterality Date   None     WISDOM TOOTH EXTRACTION     Social History   Socioeconomic History   Marital status: Married    Spouse name: Not on file   Number of children: 0   Years of education: Associates   Highest education level: Not on file  Occupational History   Occupation: LOGISTICS    Employer: [employer]  Tobacco Use   Smoking status: Never   Smokeless tobacco: Never  Vaping Use   Vaping status: Never Used  Substance and Sexual Activity   Alcohol use: No   Drug use: No   Sexual activity: Yes    Partners: Female  Other Topics Concern   Not on file  Social History Narrative   Patient is single.    Patient has no children.    Patient is full-time.    Patient has an Associates degree.   Patient is right handed.    Social Drivers of Health   Financial Resource Strain: Low Risk  (05/26/2024)   Overall Financial Resource Strain (CARDIA)    Difficulty of Paying Living Expenses: Not  hard at all  Food Insecurity: No Food Insecurity (05/26/2024)   Hunger Vital Sign    Worried About Running Out of Food in the Last Year: Never true    Ran Out of Food in the Last Year: Never true  Transportation Needs: No Transportation Needs (05/26/2024)   PRAPARE - Administrator, Civil Service (Medical): No    Lack of Transportation (Non-Medical): No  Physical Activity: Sufficiently Active (05/26/2024)   Exercise Vital Sign    Days of Exercise per Week: 7 days    Minutes of Exercise per Session: 60 min  Stress: No Stress Concern Present (05/26/2024)   [employer] of Occupational Health - Occupational Stress  Questionnaire    Feeling of Stress: Not at all  Social Connections: Not on file  Intimate Partner Violence: Not At Risk (05/26/2024)   Humiliation, Afraid, Rape, and Kick questionnaire    Fear of Current or Ex-Partner: No    Emotionally Abused: No    Physically Abused: No    Sexually Abused: No   Family Status  Relation Name Status   Mother  Alive   Father  Alive   Brother  Alive   Mat Uncle  (Not Specified)   MGM  Deceased   MGF  Alive   PGM  Deceased   PGF  Alive  No partnership data on file   Family History  Problem Relation Age of Onset   Healthy Mother    Allergies Mother    Melanoma Mother    Healthy Father    Melanoma Father    Bipolar disorder Brother        childhood   Bipolar disorder Maternal Uncle 30 - 62   Seizures Maternal Grandfather    Allergies  Allergen Reactions   Amoxicillin Diarrhea and Nausea Only    Patient Care Team: Dylann Layne, Asencion Blacksmith, DO as PCP - General (Family Medicine) Gordon Latus, PA-C as Physician Assistant (Physician Assistant) Cassandra Cleveland, MD as Consulting Physician (Neurology) Wess Hammed, NP as Nurse Practitioner (Neurology)   Medications: Outpatient Medications Prior to Visit  Medication Sig Note   FLUoxetine (PROZAC) 20 MG capsule Take 20 mg by mouth every morning.    gabapentin (NEURONTIN) 300 MG capsule Take 300 mg by mouth 3 (three) times daily.    lamoTRIgine (LAMICTAL) 200 MG tablet Take 200 mg by mouth at bedtime.    Lurasidone HCl (LATUDA) 60 MG TABS Take 1 tablet by mouth daily.    Probiotic Product (PRO-BIOTIC BLEND PO) Take 1 tablet by mouth daily.    [DISCONTINUED] albuterol  (VENTOLIN  HFA) 108 (90 Base) MCG/ACT inhaler INHALE 2 PUFFS BY MOUTH EVERY 6 HOURS AS NEEDED FOR WHEEZING OR SHORTNESS OF BREATH    [DISCONTINUED] clonazePAM  (KLONOPIN ) 0.5 MG tablet Take 1 tablet (0.5 mg total) by mouth daily. 05/26/2024: tapered off d/t no further seizures   [DISCONTINUED] ibuprofen  (ADVIL ,MOTRIN ) 600 MG tablet Take 1  tablet (600 mg total) by mouth every 6 (six) hours as needed.    [DISCONTINUED] loratadine (CLARITIN) 10 MG tablet Take 10 mg by mouth daily.    No facility-administered medications prior to visit.    Review of Systems  Constitutional:  Negative for appetite change, chills, fatigue and fever.  HENT:  Negative for congestion, ear pain, hearing loss, nosebleeds and trouble swallowing.   Eyes:  Negative for pain and visual disturbance.  Respiratory:  Negative for cough, chest tightness and shortness of breath.   Cardiovascular:  Negative for chest pain, palpitations  and leg swelling.  Gastrointestinal:  Negative for abdominal pain, blood in stool, constipation, diarrhea, nausea and vomiting.  Endocrine: Negative for polydipsia, polyphagia and polyuria.  Genitourinary:  Negative for dysuria and flank pain.  Musculoskeletal:  Negative for arthralgias, back pain, joint swelling, myalgias and neck stiffness.  Skin:  Negative for color change, rash and wound.  Neurological:  Negative for dizziness, tremors, seizures (not since 2017), speech difficulty, weakness, light-headedness and headaches.  Psychiatric/Behavioral:  Negative for behavioral problems, confusion, decreased concentration, dysphoric mood and sleep disturbance. The patient is not nervous/anxious.   All other systems reviewed and are negative.     Objective    BP (!) 125/98 (BP Location: Left Arm, Patient Position: Sitting, Cuff Size: Normal)   Pulse 78   Resp 14   Ht 5' 9 (1.753 m)   Wt 194 lb 14.4 oz (88.4 kg)   SpO2 100%   BMI 28.78 kg/m    Physical Exam Vitals and nursing note reviewed.  Constitutional:      General: He is awake.     Appearance: Normal appearance.  HENT:     Head: Normocephalic and atraumatic.     Right Ear: Tympanic membrane, ear canal and external ear normal.     Left Ear: Tympanic membrane, ear canal and external ear normal.     Nose: Nose normal.     Mouth/Throat:     Mouth: Mucous  membranes are moist.     Pharynx: Oropharynx is clear. No oropharyngeal exudate or posterior oropharyngeal erythema.   Eyes:     General: No scleral icterus.    Extraocular Movements: Extraocular movements intact.     Conjunctiva/sclera: Conjunctivae normal.     Pupils: Pupils are equal, round, and reactive to light.   Neck:     Thyroid: No thyromegaly or thyroid tenderness.   Cardiovascular:     Rate and Rhythm: Normal rate and regular rhythm.     Pulses: Normal pulses.     Heart sounds: Normal heart sounds.  Pulmonary:     Effort: Pulmonary effort is normal. No tachypnea, bradypnea or respiratory distress.     Breath sounds: Normal breath sounds. No stridor. No wheezing, rhonchi or rales.  Abdominal:     General: Bowel sounds are normal. There is no distension.     Palpations: Abdomen is soft. There is no mass.     Tenderness: There is no abdominal tenderness. There is no guarding.     Hernia: No hernia is present.   Musculoskeletal:     Cervical back: Normal range of motion and neck supple.     Right lower leg: No edema.     Left lower leg: No edema.  Lymphadenopathy:     Cervical: No cervical adenopathy.   Skin:    General: Skin is warm and dry.   Neurological:     Mental Status: He is alert and oriented to person, place, and time. Mental status is at baseline.   Psychiatric:        Mood and Affect: Mood normal.        Behavior: Behavior normal.       Last depression screening scores    05/26/2024   11:00 AM 10/02/2022    2:25 PM 02/20/2021    9:11 AM  PHQ 2/9 Scores  PHQ - 2 Score 0 2 2  PHQ- 9 Score 0 5 3   Last fall risk screening    05/26/2024   11:00 AM  Fall Risk  Falls in the past year? 0  Number falls in past yr: 0  Injury with Fall? 0  Risk for fall due to : No Fall Risks   Last Audit-C alcohol use screening    05/26/2024   10:50 AM  Alcohol Use Disorder Test (AUDIT)  1. How often do you have a drink containing alcohol? 0  2. How many  drinks containing alcohol do you have on a typical day when you are drinking? 0  3. How often do you have six or more drinks on one occasion? 0  AUDIT-C Score 0   A score of 3 or more in women, and 4 or more in men indicates increased risk for alcohol abuse, EXCEPT if all of the points are from question 1   No results found for any visits on 05/26/24.  Assessment & Plan    Routine Health Maintenance and Physical Exam  Exercise Activities and Dietary recommendations  Goals   None     Immunization History  Administered Date(s) Administered   Tdap 06/18/2018    Health Maintenance  Topic Date Due   COVID-19 Vaccine (1) 06/11/2024 (Originally 03/17/1991)   Pneumococcal Vaccine 86-84 Years old (1 of 2 - PCV) 05/26/2025 (Originally 03/16/2005)   HPV VACCINES (1 - Male 3-dose series) 05/26/2025 (Originally 03/16/2001)   INFLUENZA VACCINE  07/15/2024   DTaP/Tdap/Td (2 - Td or Tdap) 06/18/2028   Hepatitis C Screening  Completed   HIV Screening  Completed   Meningococcal B Vaccine  Aged Out    Discussed health benefits of physical activity, and encouraged him to engage in regular exercise appropriate for his age and condition.   Annual physical exam  Moderate mixed hyperlipidemia not requiring statin therapy -     Lipid panel  Generalized convulsive epilepsy (HCC)  Bipolar 1 disorder (HCC) -     TSH Rfx on Abnormal to Free T4 -     Lamotrigine level  Elevated blood-pressure reading without diagnosis of hypertension  Mild intermittent asthma without complication  Screening for endocrine, metabolic and immunity disorder -     Comprehensive metabolic panel with GFR  High risk medication use -     TSH Rfx on Abnormal to Free T4      Annual physical exam Physical exam overall unremarkable except as noted above. Routine lab work ordered as noted. Good health, low-carb diet. Family history of prostate cancer in his paternal uncle discussed. - Continue low-carb diet. -  Consider PSA screening at age 64 or earlier if family history indicates.  Elevated blood pressure without diagnosis of hypertension Elevated blood pressure, asymptomatic. Family history present. Lacks home monitoring. - Purchase home blood pressure monitor. - Monitor blood pressure at home several times a week, maintain log. - Bring monitor and log to next appointment for comparison.  Bipolar Disorder Well-managed with current medications. Reports optimal mental health. Continues psychiatric follow-up. - Continue fluoxetine, gabapentin, lamotrigine and lurasidone. - Continue follow-up with psychiatry every 3-4 months.  Defer to specialist management  General convulsive epilepsy No seizures in 8 years. Self-tapered off clonazepam , experienced withdrawal which is fully resolved at this time. Neurology follow-up recommended.  Of note, he is still on lamotrigine which is also an antiepileptic despite what sounds like the primary intent of treating bipolar disorder. - Continue lamotrigine. - Consider follow-up with neurology. - Monitor for any seizure activity.  Mild intermittent asthma without complication Noted.  Patient has not needed his albuterol  for over a year.  No acute concerns.  Continue to monitor.    Return in about 6 months (around 11/25/2024) for BP.     I discussed the assessment and treatment plan with the patient  The patient was provided an opportunity to ask questions and all were answered. The patient agreed with the plan and demonstrated an understanding of the instructions.   The patient was advised to call back or seek an in-person evaluation if the symptoms worsen or if the condition fails to improve as anticipated.    Carlean Charter, DO  [facility] [phone] (phone) [phone] (fax)  [facility]

## 2024-05-27 LAB — COMPREHENSIVE METABOLIC PANEL WITH GFR
ALT: 50 IU/L — ABNORMAL HIGH (ref 0–44)
AST: 24 IU/L (ref 0–40)
Albumin: 5.4 g/dL — ABNORMAL HIGH (ref 4.1–5.1)
Alkaline Phosphatase: 61 IU/L (ref 44–121)
BUN/Creatinine Ratio: 13 (ref 9–20)
BUN: 15 mg/dL (ref 6–20)
Bilirubin Total: 0.4 mg/dL (ref 0.0–1.2)
CO2: 20 mmol/L (ref 20–29)
Calcium: 10.1 mg/dL (ref 8.7–10.2)
Chloride: 100 mmol/L (ref 96–106)
Creatinine, Ser: 1.17 mg/dL (ref 0.76–1.27)
Globulin, Total: 2.6 g/dL (ref 1.5–4.5)
Glucose: 100 mg/dL — ABNORMAL HIGH (ref 70–99)
Potassium: 4.8 mmol/L (ref 3.5–5.2)
Sodium: 139 mmol/L (ref 134–144)
Total Protein: 8 g/dL (ref 6.0–8.5)
eGFR: 82 mL/min/{1.73_m2} (ref >59)

## 2024-05-27 LAB — LIPID PANEL
Chol/HDL Ratio: 8.2 ratio — ABNORMAL HIGH (ref 0.0–5.0)
Cholesterol, Total: 360 mg/dL — ABNORMAL HIGH (ref 100–199)
HDL: 44 mg/dL (ref >39)
LDL Chol Calc (NIH): 275 mg/dL — ABNORMAL HIGH (ref 0–99)
Triglycerides: 199 mg/dL — ABNORMAL HIGH (ref 0–149)
VLDL Cholesterol Cal: 41 mg/dL — ABNORMAL HIGH (ref 5–40)

## 2024-05-27 LAB — LAMOTRIGINE LEVEL: Lamotrigine Lvl: 4.7 ug/mL (ref 2.0–20.0)

## 2024-05-27 LAB — TSH RFX ON ABNORMAL TO FREE T4: TSH: 1.59 u[IU]/mL (ref 0.450–4.500)

## 2024-05-31 ENCOUNTER — Ambulatory Visit: Payer: Self-pay | Admitting: Family Medicine

## 2024-05-31 DIAGNOSIS — E785 Hyperlipidemia, unspecified: Secondary | ICD-10-CM

## 2024-06-01 NOTE — Telephone Encounter (Signed)
 Called patient and went over his questions about his lab results.

## 2024-06-02 DIAGNOSIS — E785 Hyperlipidemia, unspecified: Secondary | ICD-10-CM | POA: Diagnosis not present

## 2024-06-03 LAB — APO A1 + B + RATIO
Apolipo. B/A-1 Ratio: 1.4 ratio — ABNORMAL HIGH (ref 0.0–0.7)
Apolipoprotein A-1: 122 mg/dL (ref 101–178)
Apolipoprotein B: 174 mg/dL — ABNORMAL HIGH (ref <90)

## 2024-06-03 LAB — LIPID PANEL
Cholesterol, Total: 307 mg/dL — ABNORMAL HIGH (ref 100–199)
LDL Chol Calc (NIH): 236 mg/dL — ABNORMAL HIGH (ref 0–99)
Triglycerides: 179 mg/dL — ABNORMAL HIGH (ref 0–149)

## 2024-06-06 LAB — LIPOPROTEIN A (LPA): Lipoprotein (a): 16.2 nmol/L (ref <75.0)

## 2024-06-06 LAB — LIPID PANEL
Chol/HDL Ratio: 8.5 ratio — ABNORMAL HIGH (ref 0.0–5.0)
HDL: 36 mg/dL — ABNORMAL LOW (ref >39)
VLDL Cholesterol Cal: 35 mg/dL (ref 5–40)

## 2024-06-13 ENCOUNTER — Encounter: Payer: Self-pay | Admitting: Family Medicine

## 2024-06-13 MED ORDER — ROSUVASTATIN CALCIUM 20 MG PO TABS
20.0000 mg | ORAL_TABLET | Freq: Every day | ORAL | 3 refills | Status: DC
Start: 2024-06-13 — End: 2024-08-03

## 2024-06-13 NOTE — Addendum Note (Signed)
 Addended by: DONZELLA DOMINO on: 06/13/2024 12:28 PM   Modules accepted: Orders

## 2024-06-20 ENCOUNTER — Ambulatory Visit: Payer: Self-pay

## 2024-06-20 DIAGNOSIS — R55 Syncope and collapse: Secondary | ICD-10-CM

## 2024-06-20 DIAGNOSIS — E782 Mixed hyperlipidemia: Secondary | ICD-10-CM

## 2024-06-20 NOTE — Telephone Encounter (Signed)
 Action required from provider: FYI only Onset: one day ago Treatment: home remedy, rest/hydration Condition completely resolved    Copied from CRM [id number]. Topic: Clinical - Medical Advice >> Jun 20, 2024 12:50 PM Precious C wrote: Reason for CRM: Patient's spouse Rosina called stating the patient fainted and is concerned it may be related to a recently started medication, Rosuvastatin  (for high cholesterol). She is seeking guidance on what to do moving forward and requested a callback. She can be reached at [phone] at any time. Reason for Disposition  [1] Sudden standing caused simple fainting AND [2] now alert and feels fine  Answer Assessment - Initial Assessment Questions 1. ONSET: How long were you unconscious? (minutes) When did it happen?     One day ago 2. CONTENT: What happened during period of unconsciousness? (e.g., seizure activity)      States that it was very quick, about five seconds 3. MENTAL STATUS: Alert and oriented now? (oriented x 3 = name, month, location)      Alert and oriented 4. TRIGGER: What do you think caused the fainting? What were you doing just before you fainted?  (e.g., exercise, sudden standing up, prolonged standing)     Patient had been napping, upon standing and walking to kitchen, he passed out and fell in kitchen hitting his head 5. RECURRENT SYMPTOM: Have you ever passed out before? If Yes, ask: When was the last time? and What happened that time?      Patient does have history of passing out at sight of blood like for IV sticks or blood draws, but nothing like this was happening at the time 6. INJURY: Did you sustain any injury during the fall?      Hit his head on cabinet, but had red mark only, no bump or bruise 7. CARDIAC SYMPTOMS: Have you had any of the following symptoms: chest pain, difficulty breathing, palpitations?     Denies andy underlying cardiac history 8. NEUROLOGIC SYMPTOMS: Have you had any of the  following symptoms: headache, numbness, vertigo, weakness?     Headache later in evening, resolved with tylenol. History of seizure, but no visible seizure activity with this episode 9. GI SYMPTOMS: Have you had any of the following symptoms: abdomen pain, vomiting, diarrhea, blood in stools?     denies  Protocols used: Fainting-A-AH

## 2024-06-22 NOTE — Telephone Encounter (Signed)
 Spoke with wife made aware and verbalized understanding.

## 2024-06-22 NOTE — Telephone Encounter (Unsigned)
 Copied from CRM [id number]. Topic: General - Call Back - No Documentation >> Jun 22, 2024  4:07 PM DeAngela L wrote: Reason for CRM: patient called and read Dr Donzella message to pt he states understanding information

## 2024-06-22 NOTE — Addendum Note (Signed)
 Addended by: DONZELLA DOMINO on: 06/22/2024 02:57 PM   Modules accepted: Orders

## 2024-06-27 NOTE — Addendum Note (Signed)
 Addended byBETHA DONZELLA DOMINO on: 06/27/2024 02:31 PM   Modules accepted: Orders

## 2024-07-27 ENCOUNTER — Telehealth: Payer: Self-pay | Admitting: *Deleted

## 2024-07-27 NOTE — Telephone Encounter (Signed)
 LMOVM to verify card hx.

## 2024-08-01 NOTE — Progress Notes (Unsigned)
  Cardiology Office Note   Date:  08/03/2024  ID:  Creedence Kunesh, DOB 03-Jan-1986, MRN [id number] PCP: Donzella Lauraine SAILOR, DO  [facility] Providers Cardiologist:  Caron Poser, MD     History of Present Illness Jay Francis is a 38 y.o. male PMH seizure disorder, HLD who presents for further evaluation and management of HLD.  Patient presents for further evaluation of severely elevated LDL. Last LDL 236 05/2024.  It has been elevated since 2019, but has recently gotten to the 200s.  His PCP started Crestor  20 mg daily after discovery of this.  He reports his mother has very high cholesterol as well.  He denies any current symptoms.  He is on multiple psychiatric meds for underlying bipolar disorder and has about a 20 pound weight gain since 2023.  Relevant CVD History -LDL 236 06/02/2024   ROS: Pt denies any chest discomfort, jaw pain, arm pain, palpitations, syncope, presyncope, orthopnea, PND, or LE edema.  Studies Reviewed I have independently reviewed the patient's ECG and recent blood work.  Physical Exam VS:  BP 124/86 (BP Location: Right Arm, Patient Position: Sitting, Cuff Size: Normal)   Pulse 73   Ht 5' 9 (1.753 m)   Wt 201 lb 6.4 oz (91.4 kg)   SpO2 98%   BMI 29.74 kg/m        Wt Readings from Last 3 Encounters:  08/03/24 201 lb 6.4 oz (91.4 kg)  05/26/24 194 lb 14.4 oz (88.4 kg)  11/24/22 182 lb (82.6 kg)    GEN: No acute distress. NECK: No JVD; No carotid bruits. CARDIAC: RRR, no murmurs, rubs, gallops. RESPIRATORY:  Clear to auscultation. EXTREMITIES:  Warm and well-perfused. No edema.  ASSESSMENT AND PLAN HLD Cardiac risk counseling Suspected FH Pt presents with LDL 236. This is FH until proven otherwise. Alternatively, this could be severe metabolic syndrome from psychiatric medications, however he does not have uncontrolled hypertension, diabetes or severe weight gain. Regardless, we will need to look into this further and treat aggressively in the  meantime to reduce his longitudinal ASCVD risk and he is amenable to this. Fortunately, he is otherwise asymptomatic for the time being.  Plan: -Increase crestor  to 40mg  every day -Add zetia  10mg  every day -FH testing, advanced lipid profile, and lipid clinic referral        Dispo: RTC 3 months  Signed, Caron Poser, MD

## 2024-08-03 ENCOUNTER — Ambulatory Visit

## 2024-08-03 VITALS — BP 124/86 | HR 73 | Ht 69.0 in | Wt 201.4 lb

## 2024-08-03 DIAGNOSIS — E7849 Other hyperlipidemia: Secondary | ICD-10-CM | POA: Diagnosis not present

## 2024-08-03 DIAGNOSIS — Z7189 Other specified counseling: Secondary | ICD-10-CM | POA: Diagnosis not present

## 2024-08-03 DIAGNOSIS — E785 Hyperlipidemia, unspecified: Secondary | ICD-10-CM | POA: Diagnosis not present

## 2024-08-03 DIAGNOSIS — E7801 Familial hypercholesterolemia: Secondary | ICD-10-CM

## 2024-08-03 MED ORDER — ROSUVASTATIN CALCIUM 40 MG PO TABS: 40.0000 mg | ORAL_TABLET | Freq: Every day | ORAL | 3 refills | Status: AC

## 2024-08-03 MED ORDER — EZETIMIBE 10 MG PO TABS: 10.0000 mg | ORAL_TABLET | Freq: Every day | ORAL | 3 refills | Status: AC

## 2024-08-03 MED ORDER — EZETIMIBE 10 MG PO TABS
10.0000 mg | ORAL_TABLET | Freq: Every day | ORAL | 3 refills | Status: DC
Start: 2024-08-03 — End: 2024-08-03

## 2024-08-03 NOTE — Patient Instructions (Addendum)
 Medication Instructions:   Your physician recommends the following medication changes.  START TAKING: Ezetimibe  (Zetia ) 10 mg by mouth once daily  INCREASE: Rosuvastatin  (Crestor ) from 20 mg to 40 mg by mouth once daily  Take all other medications as prescribed   *If you need a refill on your cardiac medications before your next appointment, please call your pharmacy*  Lab Work: Your provider would like for you to have following labs drawn today LDL Direct, Lipoprotein A.   If you have labs (blood work) drawn today and your tests are completely normal, you will receive your results only by: MyChart Message (if you have MyChart) OR A paper copy in the mail If you have any lab test that is abnormal or we need to change your treatment, we will call you to review the results.  Testing/Procedures:  No test ordered today   Follow-Up: At [facility], you and your health needs are our priority.  As part of our continuing mission to provide you with exceptional heart care, our providers are all part of one team.  This team includes your primary Cardiologist (physician) and Advanced Practice Providers or APPs (Physician Assistants and Nurse Practitioners) who all work together to provide you with the care you need, when you need it.  Referral:  [facility], [address], [address], [place], [place] [address] [phone] with Dr. Vinie Maxcy  Your next appointment:    Follow Up as Needed  Provider:   You may see Caron Poser, MD    We recommend signing up for the patient portal called MyChart.  Sign up information is provided on this After Visit Summary.  MyChart is used to connect with patients for Virtual Visits (Telemedicine).  Patients are able to view lab/test results, encounter notes, upcoming appointments, etc.  Non-urgent messages can be sent to your provider as well.   To learn more about what you can do with MyChart, go to [email or web address].

## 2024-08-04 ENCOUNTER — Ambulatory Visit: Payer: Self-pay

## 2024-08-04 LAB — LDL CHOLESTEROL, DIRECT: LDL Direct: 56 mg/dL (ref 0–99)

## 2024-08-24 DIAGNOSIS — F410 Panic disorder [episodic paroxysmal anxiety] without agoraphobia: Secondary | ICD-10-CM | POA: Diagnosis not present

## 2024-08-24 DIAGNOSIS — F419 Anxiety disorder, unspecified: Secondary | ICD-10-CM | POA: Diagnosis not present

## 2024-08-24 DIAGNOSIS — F3181 Bipolar II disorder: Secondary | ICD-10-CM | POA: Diagnosis not present
